# Patient Record
Sex: Male | Born: 1985 | Race: Black or African American | Hispanic: No | Marital: Single | Smoking: Never smoker
Health system: Southern US, Community
[De-identification: ages and names within clinical notes are randomized; demographics above are authoritative.]

## PROBLEM LIST (undated history)

## (undated) DIAGNOSIS — A4902 Methicillin resistant Staphylococcus aureus infection, unspecified site: Secondary | ICD-10-CM

## (undated) DIAGNOSIS — F319 Bipolar disorder, unspecified: Secondary | ICD-10-CM

## (undated) DIAGNOSIS — F1910 Other psychoactive substance abuse, uncomplicated: Secondary | ICD-10-CM

---

## 2020-11-13 ENCOUNTER — Emergency Department (HOSPITAL_COMMUNITY): Payer: Self-pay

## 2020-11-13 ENCOUNTER — Other Ambulatory Visit: Payer: Self-pay

## 2020-11-13 ENCOUNTER — Emergency Department (HOSPITAL_COMMUNITY)
Admission: EM | Admit: 2020-11-13 | Discharge: 2020-11-14 | Disposition: A | Discharge status: Home / Self Care | Payer: Self-pay | Attending: Emergency Medicine | Admitting: Emergency Medicine

## 2020-11-13 DIAGNOSIS — W19XXXA Unspecified fall, initial encounter: Secondary | ICD-10-CM

## 2020-11-13 DIAGNOSIS — M25561 Pain in right knee: Secondary | ICD-10-CM | POA: Insufficient documentation

## 2020-11-13 DIAGNOSIS — Z5321 Procedure and treatment not carried out due to patient leaving prior to being seen by health care provider: Secondary | ICD-10-CM | POA: Insufficient documentation

## 2020-11-13 LAB — CBC
HCT: 40.4 % (ref 39.0–52.0)
Hemoglobin: 13.5 g/dL (ref 13.0–17.0)
MCH: 32.6 pg (ref 26.0–34.0)
MCHC: 33.4 g/dL (ref 30.0–36.0)
MCV: 97.6 fL (ref 80.0–100.0)
Platelets: 281 10*3/uL (ref 150–400)
RBC: 4.14 MIL/uL — ABNORMAL LOW (ref 4.22–5.81)
RDW: 12.3 % (ref 11.5–15.5)
WBC: 7.2 10*3/uL (ref 4.0–10.5)
nRBC: 0 % (ref 0.0–0.2)

## 2020-11-13 LAB — BASIC METABOLIC PANEL
Anion gap: 11 (ref 5–15)
BUN: 13 mg/dL (ref 6–20)
CO2: 23 mmol/L (ref 22–32)
Calcium: 8.9 mg/dL (ref 8.9–10.3)
Chloride: 105 mmol/L (ref 98–111)
Creatinine, Ser: 0.72 mg/dL (ref 0.61–1.24)
GFR, Estimated: >60 mL/min (ref >60)
Glucose, Bld: 84 mg/dL (ref 70–99)
Potassium: 4.3 mmol/L (ref 3.5–5.1)
Sodium: 139 mmol/L (ref 135–145)

## 2020-11-13 MED ORDER — OXYCODONE-ACETAMINOPHEN 5-325 MG PO TABS
1.0000 | ORAL_TABLET | ORAL | Status: DC | PRN
Start: 2020-11-13 — End: 2020-11-14
  Administered 2020-11-13: 22:00:00 1 via ORAL
  Filled 2020-11-13: qty 1

## 2020-11-13 NOTE — ED Triage Notes (Signed)
Pt presents to ED POV. Pt c/o R knee pain. Pt states that he fell and his knee opened a few days ago. Pain has continued to worsen and now puss is coming out. Pt asked RN not to assess knee in triage d/t pain. Knee felt warm to touch.

## 2020-11-14 NOTE — ED Notes (Signed)
Pt called 2x for vitals w/o response at this time.

## 2020-11-14 NOTE — ED Notes (Signed)
Patient stated that he was leaving, and would be back, and to not take him out. I informed him, if we called him, to move him to a room, we would discharge him out. He stated not to put anything in chart.

## 2020-11-14 NOTE — ED Provider Notes (Signed)
Not seen by me. LBE from waiting room.   Mancel Bale, MD 11/14/20 [phone]

## 2020-11-14 NOTE — ED Notes (Signed)
This RN called pt name 3x. No answer. Pt moved OTF.

## 2021-03-21 ENCOUNTER — Encounter (HOSPITAL_COMMUNITY): Payer: Self-pay | Admitting: Emergency Medicine

## 2021-03-21 ENCOUNTER — Other Ambulatory Visit: Payer: Self-pay

## 2021-03-21 ENCOUNTER — Ambulatory Visit (HOSPITAL_COMMUNITY)
Admission: EM | Admit: 2021-03-21 | Discharge: 2021-03-21 | Disposition: A | Discharge status: Home / Self Care | Payer: Self-pay

## 2021-03-21 DIAGNOSIS — Z973 Presence of spectacles and contact lenses: Secondary | ICD-10-CM

## 2021-03-21 HISTORY — DX: Bipolar disorder, unspecified: F31.9

## 2021-03-21 NOTE — ED Provider Notes (Signed)
[facility]    CSN: [id number] Arrival date & time: 03/21/21  1544      History   Chief Complaint No chief complaint on file.   HPI Kenna Kirn is a 35 y.o. male presenting for possible contact in left eye.  States he went to bed with this in his eye 1 day ago, he woke up and could not find it.  States that he could not find the contact and it kind of feel like it still in the eye.  Denies absolutely any other symptoms.  Forgot his glasses today.  Denies photophobia, eye redness, eye crusting in the morning, eye pain, eye pain with movement, injury to eye, vision changes, double vision, excessive tearing, burning eyes, flashes of light in field of vision.  HPI  Past Medical History:  Diagnosis Date   Bipolar 1 disorder (HCC)     There are no problems to display for this patient.   History reviewed. No pertinent surgical history.     Home Medications    Prior to Admission medications   Not on File    Family History Family History  Problem Relation Age of Onset   Hypertension Mother    Diabetes Father     Social History Social History   Tobacco Use   Smoking status: Never   Smokeless tobacco: Never  Substance Use Topics   Alcohol use: Not Currently   Drug use: Never     Allergies   Patient has no known allergies.   Review of Systems Review of Systems  Eyes:  Negative for photophobia, pain, discharge, redness, itching and visual disturbance.  All other systems reviewed and are negative.   Physical Exam Triage Vital Signs ED Triage Vitals [03/21/21 1704]  Enc Vitals Group     BP 113/71     Pulse Rate 78     Resp 18     Temp 98 F (36.7 C)     Temp Source Oral     SpO2 100 %     Weight      Height      Head Circumference      Peak Flow      Pain Score 0     Pain Loc      Pain Edu?      Excl. in GC?    No data found.  Updated Vital Signs BP 113/71 (BP Location: Right Arm)   Pulse 78   Temp 98 F (36.7 C) (Oral)    Resp 18   SpO2 100%   Visual Acuity Right Eye Distance: 20/15 Left Eye Distance: 0 Bilateral Distance: 20/20  Right Eye Near:   Left Eye Near:    Bilateral Near:     Physical Exam Vitals reviewed.  Constitutional:      Appearance: Normal appearance.  HENT:     Head: Normocephalic and atraumatic.     Right Ear: Tympanic membrane, ear canal and external ear normal. There is no impacted cerumen.     Left Ear: Tympanic membrane, ear canal and external ear normal. There is no impacted cerumen.     Nose: Nose normal. No congestion.     Mouth/Throat:     Pharynx: Oropharynx is clear. No posterior oropharyngeal erythema.  Eyes:     General: Lids are normal. Lids are everted, no foreign bodies appreciated. Vision grossly intact. Gaze aligned appropriately. No visual field deficit.       Right eye: No foreign body, discharge or hordeolum.  Left eye: No foreign body, discharge or hordeolum.     Extraocular Movements: Extraocular movements intact.     Right eye: Normal extraocular motion and no nystagmus.     Left eye: Normal extraocular motion and no nystagmus.     Conjunctiva/sclera: Conjunctivae normal.     Right eye: Right conjunctiva is not injected. No chemosis, exudate or hemorrhage.    Left eye: Left conjunctiva is not injected. No chemosis, exudate or hemorrhage.    Pupils: Pupils are equal, round, and reactive to light.     Visual Fields: Right eye visual fields normal and left eye visual fields normal.     Comments: PERRLA, EOMI without pain. No conjunctival injection. No orbital tenderness. Visual acuity grossly intact. No foreign body present.   Cardiovascular:     Rate and Rhythm: Normal rate and regular rhythm.     Heart sounds: Normal heart sounds.  Pulmonary:     Effort: Pulmonary effort is normal.     Breath sounds: Normal breath sounds.  Neurological:     General: No focal deficit present.     Mental Status: He is alert.  Psychiatric:        Mood and  Affect: Mood normal.        Behavior: Behavior normal.        Thought Content: Thought content normal.        Judgment: Judgment normal.     UC Treatments / Results  Labs (all labs ordered are listed, but only abnormal results are displayed) Labs Reviewed - No data to display  EKG   Radiology No results found.  Procedures Procedures (including critical care time)  Medications Ordered in UC Medications - No data to display  Initial Impression / Assessment and Plan / UC Course  I have reviewed the triage vital signs and the nursing notes.  Pertinent labs & imaging results that were available during my care of the patient were reviewed by me and considered in my medical decision making (see chart for details).     This patient is a 35 year old male presenting for concern of a contact stuck in left eye.  Visual acuity is grossly intact, but he forgot his contacts and so cannot perform visual acuity screening.  Completely benign exam, no foreign body present.  Reassurance provided, follow-up with ophthalmology if symptoms worsen or persist.  Final Clinical Impressions(s) / UC Diagnoses   Final diagnoses:  Wears contact lenses     Discharge Instructions      -Follow-up with ophthalmology if symptoms worsen or persist-new redness, discharge, swelling, pain with eye movement, vision changes, flashes of light in your field of vision, etc.  Information below.     ED Prescriptions   None    PDMP not reviewed this encounter.   Rhys Martini, PA-C 03/21/21 1751

## 2021-03-21 NOTE — Discharge Instructions (Addendum)
-  Follow-up with ophthalmology if symptoms worsen or persist-new redness, discharge, swelling, pain with eye movement, vision changes, flashes of light in your field of vision, etc.  Information below.

## 2021-03-21 NOTE — ED Triage Notes (Signed)
Patient presents to [facility] for evaluation of misplacing his left contact.  States he went to bed with it in his eye, and believes it has rolled towards the side where he can't reach it to pull it out.  Denies eye pain.  States he has his normal poor vision without the contact in that eye.

## 2022-01-24 IMAGING — CR DG KNEE COMPLETE 4+V*R*
4 series · 4 of 4 positions shown · non-contrast
Comparison: None.

CLINICAL DATA: Right knee pain, fall

EXAM:
RIGHT KNEE - COMPLETE 4+ VIEW

[knee ap]
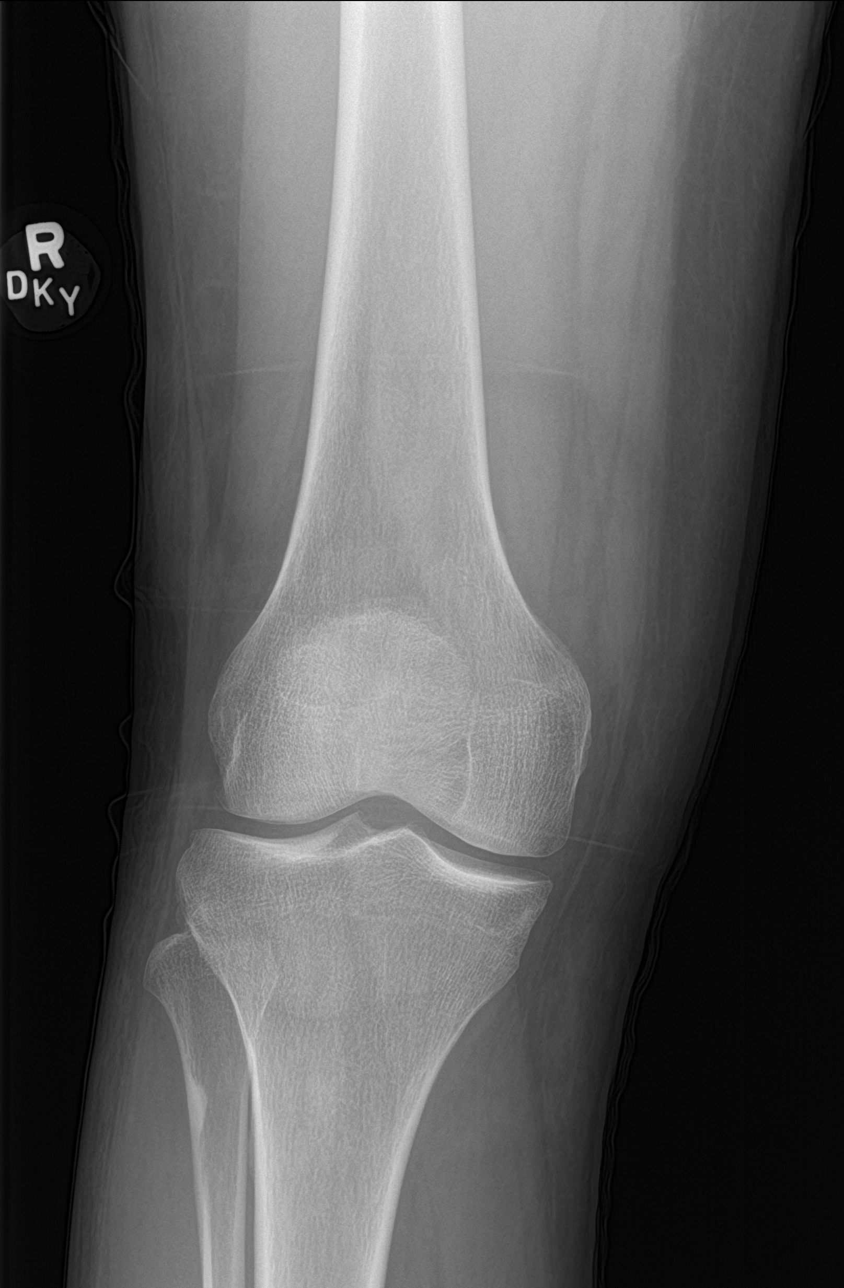

[knee lat]
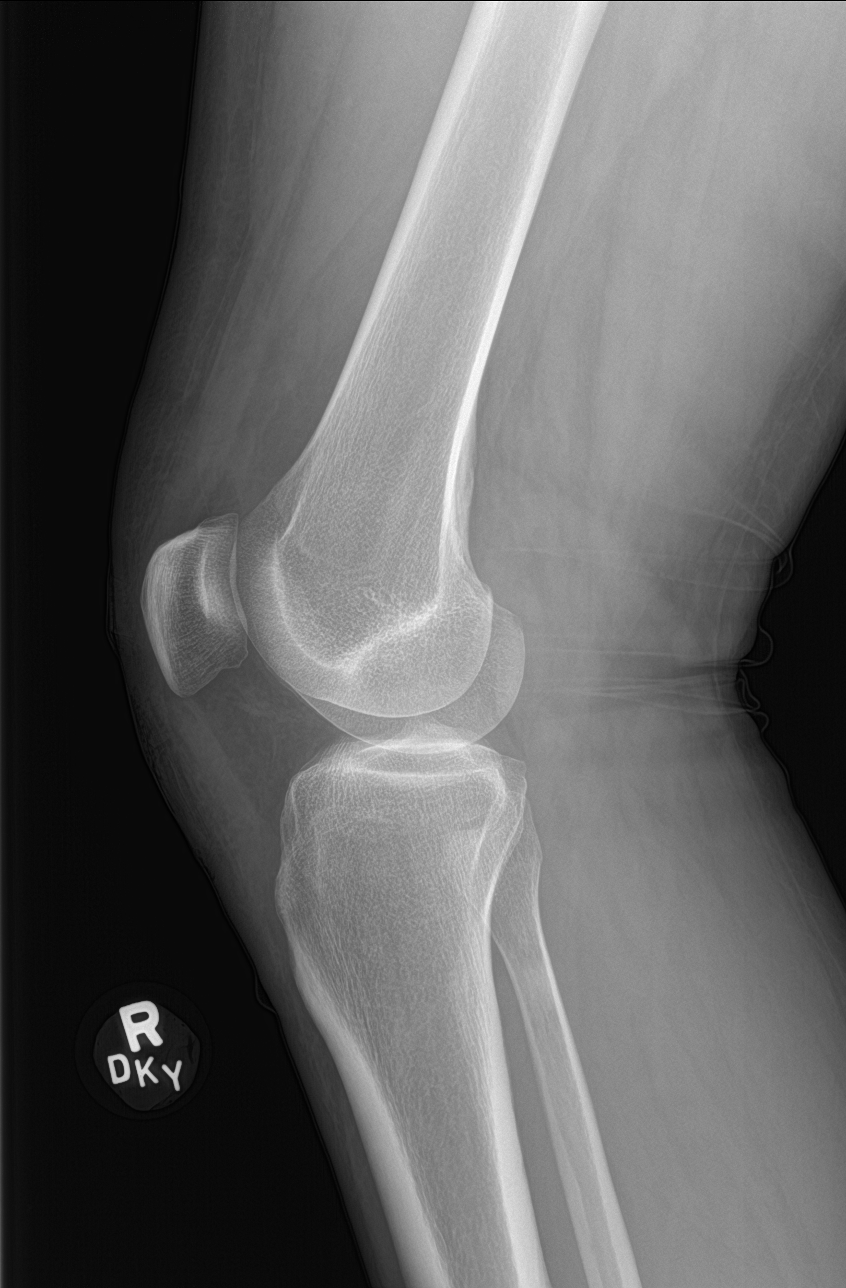

[knee obl (1 of 2)]
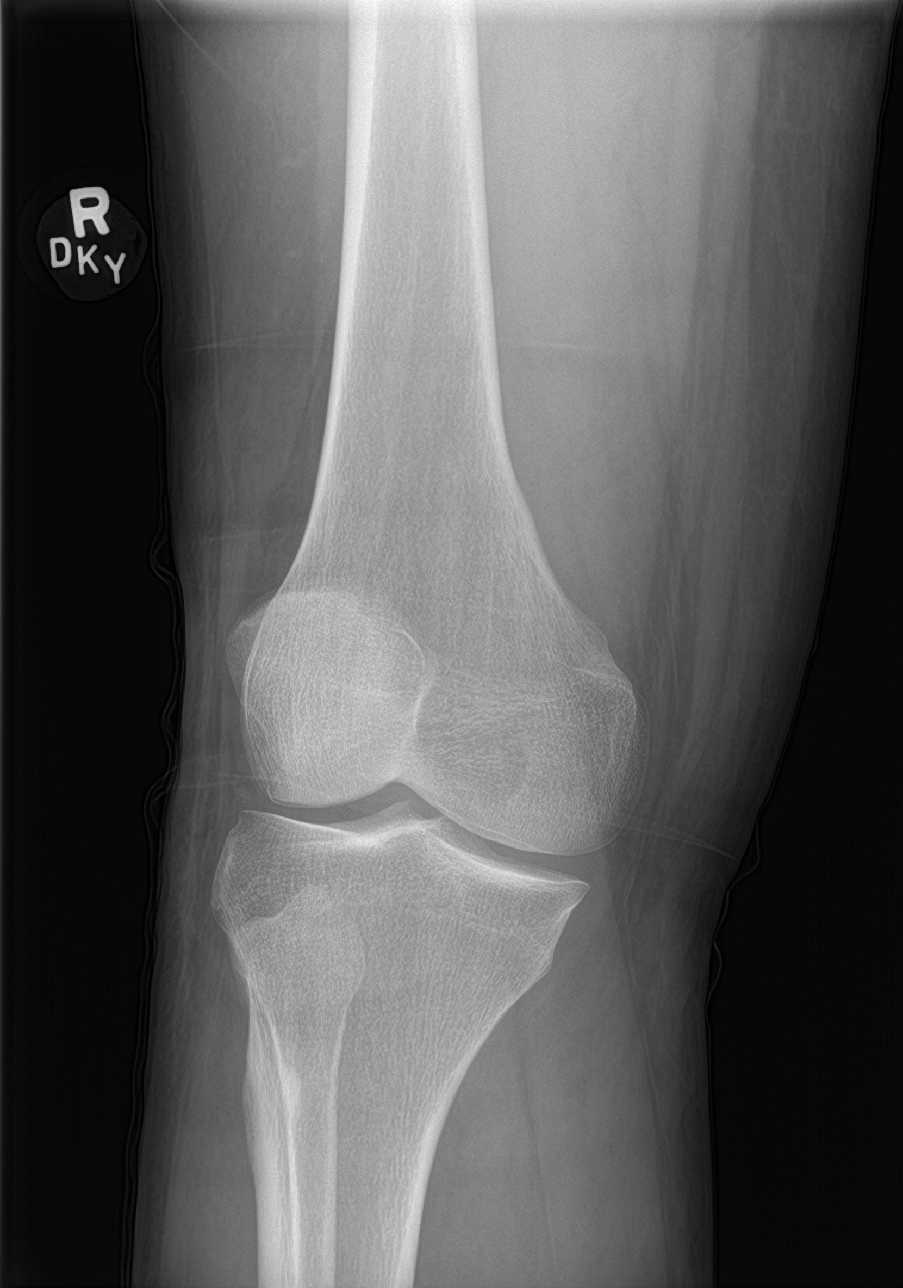

[knee obl (2 of 2)]
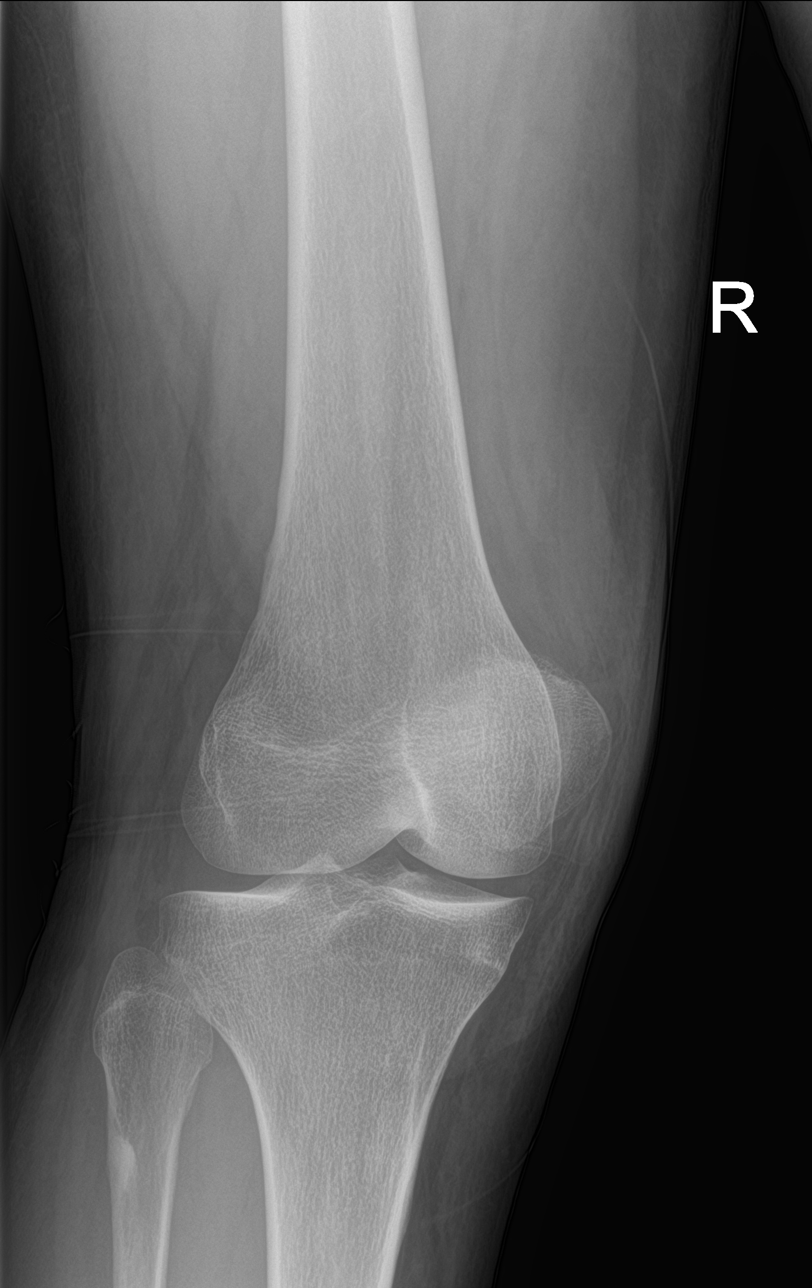

[4 of 4 positions shown; findings below may reference images not displayed]

FINDINGS: No evidence of fracture, dislocation, or joint effusion. No evidence
of arthropathy or other focal bone abnormality. Soft tissues are
unremarkable.
IMPRESSION: Negative.

## 2022-10-13 ENCOUNTER — Encounter (HOSPITAL_COMMUNITY): Payer: Self-pay

## 2022-10-13 ENCOUNTER — Other Ambulatory Visit: Payer: Self-pay

## 2022-10-13 ENCOUNTER — Emergency Department (HOSPITAL_COMMUNITY)
Admission: EM | Admit: 2022-10-13 | Discharge: 2022-10-13 | Disposition: A | Discharge status: Home / Self Care | Payer: Self-pay | Attending: Emergency Medicine | Admitting: Emergency Medicine

## 2022-10-13 DIAGNOSIS — R142 Eructation: Secondary | ICD-10-CM | POA: Insufficient documentation

## 2022-10-13 DIAGNOSIS — Z7689 Persons encountering health services in other specified circumstances: Secondary | ICD-10-CM

## 2022-10-13 NOTE — ED Triage Notes (Signed)
Pt arrived via POV, states he recently had a "stomach flu", sx have resolved, but requesting note to return to work. Pt also states while he is here he is requesting evaluation for excessive belching episodes that are becoming longer that have been going on since 8th grade.

## 2022-10-13 NOTE — ED Provider Notes (Signed)
  [place] DEPT Provider Note   CSN: [id number] Arrival date & time: 10/13/22  1014     History  Chief Complaint  Patient presents with   belching    Ronnie Herman is a 37 y.o. male with history of bipolar disorder who presents to the emergency department requesting note to return to work. Patient has a "stomach flu" last week. Symptoms have resolved at this time, but he is needing a work note. He also is complaining of excessive belching, which has been going on for 25+ years. States he has never brought it up with a medical provider before. He has tried Nurse, adult without relief. He thinks it could be behavioral as he doesn't notice it as much when he is not smoking weed or using drugs.   HPI     Home Medications Prior to Admission medications   Not on File      Allergies    Patient has no known allergies.    Review of Systems   Review of Systems  Gastrointestinal:        Belching  All other systems reviewed and are negative.   Physical Exam Updated Vital Signs BP 131/81 (BP Location: Left Arm)   Pulse 78   Temp 98.6 F (37 C) (Oral)   Resp 16   SpO2 99%  Physical Exam Vitals and nursing note reviewed.  Constitutional:      Appearance: Normal appearance.  HENT:     Head: Normocephalic and atraumatic.  Eyes:     Conjunctiva/sclera: Conjunctivae normal.  Pulmonary:     Effort: Pulmonary effort is normal. No respiratory distress.  Skin:    General: Skin is warm and dry.  Neurological:     Mental Status: He is alert.  Psychiatric:        Mood and Affect: Mood normal.        Behavior: Behavior normal.    ED Results / Procedures / Treatments   Labs (all labs ordered are listed, but only abnormal results are displayed) Labs Reviewed - No data to display  EKG None  Radiology No results found.  Procedures Procedures    Medications Ordered in ED Medications - No data to display  ED Course/ Medical Decision Making/  A&P                           Medical Decision Making  Patient is a 37 year old male with history of bipolar disorder who presents to the emergency department for chronic belching and requesting work note after having the stomach flu last week. Symptoms have resolved.   On exam patient has normal vital signs, in no acute distress.  Recommended he try over the counter medications such as Gas-X, pepcid or prilosec to help with his chronic belching. Given follow up with [facility] to establish with PCP. Patient discharged in stable condition and all questions answered.   Final Clinical Impression(s) / ED Diagnoses Final diagnoses:  Belching  Return to work evaluation    Rx / DC Orders ED Discharge Orders     None      Portions of this report may have been transcribed using voice recognition software. Every effort was made to ensure accuracy; however, inadvertent computerized transcription errors may be present.    Estill Cotta 10/13/22 1138    Valarie Merino, MD 10/13/22 1356

## 2022-10-13 NOTE — Discharge Instructions (Signed)
You were seen in the emergency department for a work note and to discuss chronic belching.  I recommend you take over the counter medications like Gas-X and reflux medications such as Pepcid or Prilosec.  I've attached contact information for one of the clinics we work with where you can establish with a primary care provider to follow up.

## 2022-10-29 ENCOUNTER — Other Ambulatory Visit: Payer: Self-pay

## 2022-10-29 ENCOUNTER — Emergency Department (HOSPITAL_COMMUNITY): Payer: Self-pay

## 2022-10-29 ENCOUNTER — Emergency Department (HOSPITAL_COMMUNITY)
Admission: EM | Admit: 2022-10-29 | Discharge: 2022-10-30 | Disposition: A | Discharge status: Home / Self Care | Payer: Self-pay | Attending: Emergency Medicine | Admitting: Emergency Medicine

## 2022-10-29 DIAGNOSIS — J189 Pneumonia, unspecified organism: Secondary | ICD-10-CM | POA: Insufficient documentation

## 2022-10-29 DIAGNOSIS — Z5900 Homelessness unspecified: Secondary | ICD-10-CM | POA: Insufficient documentation

## 2022-10-29 DIAGNOSIS — Z1152 Encounter for screening for COVID-19: Secondary | ICD-10-CM | POA: Insufficient documentation

## 2022-10-29 LAB — RESP PANEL BY RT-PCR (RSV, FLU A&B, COVID)  RVPGX2
Influenza A by PCR: NEGATIVE
Influenza B by PCR: NEGATIVE
Resp Syncytial Virus by PCR: NEGATIVE
SARS Coronavirus 2 by RT PCR: NEGATIVE

## 2022-10-29 LAB — GROUP A STREP BY PCR: Group A Strep by PCR: NOT DETECTED
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
## 2022-10-30 MED ORDER — DOXYCYCLINE HYCLATE 100 MG PO TABS
100.0000 mg | ORAL_TABLET | Freq: Once | ORAL | Status: AC
Start: 2022-10-30 — End: 2022-10-30
  Administered 2022-10-30: 100 mg via ORAL
  Filled 2022-10-30: qty 1

## 2022-10-30 MED ORDER — BENZONATATE 100 MG PO CAPS
100.0000 mg | ORAL_CAPSULE | Freq: Once | ORAL | Status: AC
Start: 2022-10-30 — End: 2022-10-30
  Administered 2022-10-30: 100 mg via ORAL
  Filled 2022-10-30 (×2): qty 1

## 2022-10-30 MED ORDER — BENZONATATE 100 MG PO CAPS: 100.0000 mg | ORAL_CAPSULE | Freq: Three times a day (TID) | ORAL | 0 refills | Status: DC

## 2022-10-30 MED ORDER — DOXYCYCLINE HYCLATE 100 MG PO CAPS: 100.0000 mg | ORAL_CAPSULE | Freq: Two times a day (BID) | ORAL | 0 refills | Status: DC

## 2022-10-30 NOTE — Discharge Instructions (Signed)
Take the prescribed medication as directed. Follow-up with your primary care doctor-- will need repeat chest x-ray once you finish antibiotics to make sure infection has cleared. Return to the ED for new or worsening symptoms.
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
## 2022-11-04 ENCOUNTER — Emergency Department (HOSPITAL_COMMUNITY)
Admission: EM | Admit: 2022-11-04 | Discharge: 2022-11-04 | Disposition: A | Discharge status: Home / Self Care | Payer: Self-pay | Attending: Emergency Medicine | Admitting: Emergency Medicine

## 2022-11-04 ENCOUNTER — Other Ambulatory Visit: Payer: Self-pay

## 2022-11-04 ENCOUNTER — Emergency Department (HOSPITAL_COMMUNITY): Payer: Self-pay

## 2022-11-04 DIAGNOSIS — J189 Pneumonia, unspecified organism: Secondary | ICD-10-CM | POA: Insufficient documentation

## 2022-11-04 NOTE — ED Triage Notes (Signed)
Pt reports "I need work note to return to work" Pt seen for PNA on 10/29/22.  Pt denies taking abt due to no having insurance.  Denies fever, sob, body aches, chills.

## 2022-11-04 NOTE — Discharge Instructions (Addendum)
Please read and follow all provided instructions.  Your diagnoses today include:  1. Community acquired pneumonia of left lung, unspecified part of lung    Tests performed today include: Chest x-ray -- shows pneumonia Vital signs. See below for your results today.   Medications prescribed:  None  Take any prescribed medications only as directed.  Home care instructions:  Follow any educational materials contained in this packet.  Ideally fill and take the antibiotic prescription you were prescribed previously.  Follow-up instructions: You will need to follow-up with your doctor in 3 to 4 weeks for repeat chest x-ray to ensure pneumonia is resolving, if the chest x-ray is still abnormal you would need further evaluation for this.  Return instructions:  Please return to the Emergency Department if you experience worsening symptoms.  Return immediately with worsening breathing, worsening shortness of breath, or if you feel it is taking you more effort to breathe.  Please return if you have any other emergent concerns.  Additional Information:  Your vital signs today were: BP 120/87   Pulse 98   Temp 98 F (36.7 C)   Resp 20   SpO2 100%  If your blood pressure (BP) was elevated above 135/85 this visit, please have this repeated by your doctor within one month. --------------

## 2022-11-04 NOTE — ED Provider Triage Note (Signed)
Emergency Medicine Provider Triage Evaluation Note  Ronnie Herman , a 37 y.o. male  was evaluated in triage.  Pt complains of recent ammonia.  Patient reports that he needed to come to the emergency department for a work note to be able to return to work as he was previously diagnosed with pneumonia on 10/29/2022.  Patient reports he has not taken the antibiotic as prescribed as he cannot afford the medication.  Patient denies any shortness of breath, fevers.  Patient still having a slight somewhat productive cough.  Review of Systems  Positive: As above Negative: As above  Physical Exam  There were no vitals taken for this visit. Gen:   Awake, no distress   Resp:  Normal effort no wheezing or crackles MSK:   Moves extremities without difficulty  Other:    Medical Decision Making  Medically screening exam initiated at 12:56 PM.  Appropriate orders placed.  Tramar Brueckner was informed that the remainder of the evaluation will be completed by another provider, this initial triage assessment does not replace that evaluation, and the importance of remaining in the ED until their evaluation is complete.     Luvenia Heller, PA-C 11/04/22 1257

## 2022-11-04 NOTE — ED Provider Notes (Signed)
[place] EMERGENCY DEPARTMENT AT [facility] Provider Note   CSN: [id number] Arrival date & time: 11/04/22  1245     History  Chief Complaint  Patient presents with   work note    Ronnie Herman is a 37 y.o. male.  Patient presents to the emergency department today for medical clearance to return to work after being diagnosed with community-acquired pneumonia on 10/29/2022.  Patient states that they are feeling a lot better now.  They have some residual cough, but no fevers, shortness of breath.  Patient states that they are ready to go back to work.  Unfortunately, they were unable to fill the antibiotic prescribed at previous visit.  Also, was recently seen by [facility], but does not currently have a PCP.       Home Medications Prior to Admission medications   Medication Sig Start Date End Date Taking? Authorizing Provider  benzonatate (TESSALON) 100 MG capsule Take 1 capsule (100 mg total) by mouth every 8 (eight) hours. 10/30/22   Larene Pickett, PA-C  doxycycline (VIBRAMYCIN) 100 MG capsule Take 1 capsule (100 mg total) by mouth 2 (two) times daily. 10/30/22   Larene Pickett, PA-C      Allergies    Patient has no known allergies.    Review of Systems   Review of Systems  Physical Exam Updated Vital Signs BP 120/87   Pulse 98   Temp 98 F (36.7 C)   Resp 20   SpO2 100%  Physical Exam Vitals and nursing note reviewed.  Constitutional:      General: He is not in acute distress.    Appearance: He is well-developed.  HENT:     Head: Normocephalic and atraumatic.  Eyes:     General:        Right eye: No discharge.        Left eye: No discharge.     Conjunctiva/sclera: Conjunctivae normal.  Cardiovascular:     Rate and Rhythm: Normal rate and regular rhythm.     Heart sounds: Normal heart sounds.  Pulmonary:     Effort: Pulmonary effort is normal.     Breath sounds: Normal breath sounds.     Comments: Normal respiratory  effort, lungs clear to auscultation bilaterally. Abdominal:     Palpations: Abdomen is soft.     Tenderness: There is no abdominal tenderness.  Musculoskeletal:     Cervical back: Normal range of motion and neck supple.  Skin:    General: Skin is warm and dry.  Neurological:     Mental Status: He is alert.     ED Results / Procedures / Treatments   Labs (all labs ordered are listed, but only abnormal results are displayed) Labs Reviewed - No data to display  EKG None  Radiology DG Chest 2 View  Result Date: 11/04/2022 CLINICAL DATA:  Slightly productive cough for the past week, diagnosed pneumonia. EXAM: CHEST - 2 VIEW COMPARISON:  10/29/2022 FINDINGS: Normal sized heart. No significant change in the previously demonstrated airspace opacity in the lingula. The remainder of the lungs are clear. Mild scoliosis. IMPRESSION: Stable probable lingular pneumonia. Followup PA and lateral chest X-ray is recommended in 3-4 weeks following trial of antibiotic therapy to ensure resolution and exclude underlying malignancy. Electronically Signed   By: Claudie Revering M.D.   On: 11/04/2022 13:11    Procedures Procedures    Medications Ordered in ED Medications - No data to display  ED Course/ Medical  Decision Making/ A&P    Patient seen and examined. History obtained directly from patient. Work-up including labs, imaging, EKG ordered in triage, if performed, were reviewed.    Labs/EKG: Ordered  Imaging: Chest x-ray, agree stable from previous  Medications/Fluids: None ordered  Most recent vital signs reviewed and are as follows: BP 120/87   Pulse 98   Temp 98 F (36.7 C)   Resp 20   SpO2 100%   Initial impression: Community acquired pneumonia, clinically improved.  Vital signs reassuring.  Home treatment plan: Continued monitoring of symptoms  Return instructions discussed with patient: Return with worsening shortness of breath, fever, worsening cough, new symptoms or other  concerns.  Follow-up instructions discussed with patient: Discussed recommendations for follow-up chest x-ray in 3 to 4 weeks to ensure resolution of pneumonia and to ensure no need for further evaluation.     Click here for ABCD2, HEART and other calculatorsREFRESH Note before signing :1}                          Medical Decision Making  Patient with clinically improved community-acquired pneumonia.  Low concern for pneumothorax, PE.  Low concern for complicated pneumonia such as empyema.  Recommendations discussed with patient as above.  They will be given note to return to work.  The patient's vital signs, pertinent lab work and imaging were reviewed and interpreted as discussed in the ED course. Hospitalization was considered for further testing, treatments, or serial exams/observation. However as patient is well-appearing, has a stable exam, and reassuring studies today, I do not feel that they warrant admission at this time. This plan was discussed with the patient who verbalizes agreement and comfort with this plan and seems reliable and able to return to the Emergency Department with worsening or changing symptoms.          Final Clinical Impression(s) / ED Diagnoses Final diagnoses:  Community acquired pneumonia of left lung, unspecified part of lung    Rx / DC Orders ED Discharge Orders     None         Carlisle Cater, PA-C 11/04/22 [place], [place], DO 11/04/22 1943

## 2022-11-14 ENCOUNTER — Emergency Department (HOSPITAL_COMMUNITY): Payer: Medicaid Other

## 2022-11-14 ENCOUNTER — Encounter (HOSPITAL_COMMUNITY): Payer: Self-pay | Admitting: Emergency Medicine

## 2022-11-14 ENCOUNTER — Other Ambulatory Visit: Payer: Self-pay

## 2022-11-14 ENCOUNTER — Inpatient Hospital Stay (HOSPITAL_COMMUNITY)
Admission: EM | Admit: 2022-11-14 | Discharge: 2022-11-16 | DRG: 871 | Disposition: A | Discharge status: Home / Self Care | Payer: Medicaid Other | Attending: Internal Medicine | Admitting: Internal Medicine

## 2022-11-14 DIAGNOSIS — Z8249 Family history of ischemic heart disease and other diseases of the circulatory system: Secondary | ICD-10-CM

## 2022-11-14 DIAGNOSIS — Z1152 Encounter for screening for COVID-19: Secondary | ICD-10-CM

## 2022-11-14 DIAGNOSIS — F1210 Cannabis abuse, uncomplicated: Secondary | ICD-10-CM | POA: Diagnosis present

## 2022-11-14 DIAGNOSIS — J189 Pneumonia, unspecified organism: Secondary | ICD-10-CM | POA: Diagnosis not present

## 2022-11-14 DIAGNOSIS — N50819 Testicular pain, unspecified: Secondary | ICD-10-CM | POA: Diagnosis present

## 2022-11-14 DIAGNOSIS — R0902 Hypoxemia: Secondary | ICD-10-CM | POA: Diagnosis present

## 2022-11-14 DIAGNOSIS — K047 Periapical abscess without sinus: Secondary | ICD-10-CM | POA: Diagnosis present

## 2022-11-14 DIAGNOSIS — Z5901 Sheltered homelessness: Secondary | ICD-10-CM

## 2022-11-14 DIAGNOSIS — F1510 Other stimulant abuse, uncomplicated: Secondary | ICD-10-CM | POA: Diagnosis present

## 2022-11-14 DIAGNOSIS — F319 Bipolar disorder, unspecified: Secondary | ICD-10-CM | POA: Diagnosis present

## 2022-11-14 DIAGNOSIS — F1990 Other psychoactive substance use, unspecified, uncomplicated: Secondary | ICD-10-CM | POA: Diagnosis present

## 2022-11-14 DIAGNOSIS — F419 Anxiety disorder, unspecified: Secondary | ICD-10-CM | POA: Diagnosis present

## 2022-11-14 DIAGNOSIS — J159 Unspecified bacterial pneumonia: Secondary | ICD-10-CM | POA: Diagnosis present

## 2022-11-14 DIAGNOSIS — A419 Sepsis, unspecified organism: Principal | ICD-10-CM | POA: Diagnosis present

## 2022-11-14 DIAGNOSIS — Z833 Family history of diabetes mellitus: Secondary | ICD-10-CM

## 2022-11-14 DIAGNOSIS — E872 Acidosis, unspecified: Secondary | ICD-10-CM | POA: Diagnosis present

## 2022-11-14 DIAGNOSIS — E869 Volume depletion, unspecified: Secondary | ICD-10-CM | POA: Diagnosis present

## 2022-11-14 DIAGNOSIS — F1910 Other psychoactive substance abuse, uncomplicated: Secondary | ICD-10-CM | POA: Diagnosis present

## 2022-11-14 DIAGNOSIS — E876 Hypokalemia: Secondary | ICD-10-CM | POA: Diagnosis present

## 2022-11-14 HISTORY — DX: Other psychoactive substance abuse, uncomplicated: F19.10

## 2022-11-14 LAB — CBC
HCT: 41.8 % (ref 39.0–52.0)
Hemoglobin: 13.4 g/dL (ref 13.0–17.0)
MCH: 31.2 pg (ref 26.0–34.0)
MCHC: 32.1 g/dL (ref 30.0–36.0)
MCV: 97.2 fL (ref 80.0–100.0)
Platelets: 325 10*3/uL (ref 150–400)
RBC: 4.3 MIL/uL (ref 4.22–5.81)
RDW: 13.3 % (ref 11.5–15.5)
WBC: 25.4 10*3/uL — ABNORMAL HIGH (ref 4.0–10.5)
nRBC: 0 % (ref 0.0–0.2)

## 2022-11-14 LAB — RESP PANEL BY RT-PCR (RSV, FLU A&B, COVID)  RVPGX2
Influenza A by PCR: NEGATIVE
Influenza B by PCR: NEGATIVE
Resp Syncytial Virus by PCR: NEGATIVE
SARS Coronavirus 2 by RT PCR: NEGATIVE

## 2022-11-14 LAB — RAPID URINE DRUG SCREEN, HOSP PERFORMED
Amphetamines: POSITIVE — AB
Barbiturates: NOT DETECTED
Benzodiazepines: NOT DETECTED
Cocaine: NOT DETECTED
Opiates: NOT DETECTED
Tetrahydrocannabinol: POSITIVE — AB

## 2022-11-14 LAB — BASIC METABOLIC PANEL
Anion gap: 12 (ref 5–15)
BUN: 15 mg/dL (ref 6–20)
CO2: 27 mmol/L (ref 22–32)
Calcium: 9 mg/dL (ref 8.9–10.3)
Chloride: 95 mmol/L — ABNORMAL LOW (ref 98–111)
Creatinine, Ser: 1.19 mg/dL (ref 0.61–1.24)
GFR, Estimated: >60 mL/min (ref >60)
Glucose, Bld: 104 mg/dL — ABNORMAL HIGH (ref 70–99)
Potassium: 3.5 mmol/L (ref 3.5–5.1)
Sodium: 134 mmol/L — ABNORMAL LOW (ref 135–145)

## 2022-11-14 LAB — D-DIMER, QUANTITATIVE: D-Dimer, Quant: 1.78 ug/mL-FEU — ABNORMAL HIGH (ref 0.00–0.50)

## 2022-11-14 LAB — TROPONIN I (HIGH SENSITIVITY)
Troponin I (High Sensitivity): 2 ng/L (ref <18)
Troponin I (High Sensitivity): 3 ng/L (ref <18)

## 2022-11-14 LAB — STREP PNEUMONIAE URINARY ANTIGEN: Strep Pneumo Urinary Antigen: NEGATIVE

## 2022-11-14 MED ORDER — SODIUM CHLORIDE 0.9 % IV BOLUS
1000.0000 mL | Freq: Once | INTRAVENOUS | Status: AC
  Administered 2022-11-14: 1000 mL via INTRAVENOUS

## 2022-11-14 MED ORDER — TRAZODONE HCL 50 MG PO TABS
50.0000 mg | ORAL_TABLET | Freq: Every evening | ORAL | Status: DC | PRN
  Administered 2022-11-14 – 2022-11-15 (×2): 50 mg via ORAL
  Filled 2022-11-14 (×2): qty 1

## 2022-11-14 MED ORDER — TRAMADOL HCL 50 MG PO TABS
50.0000 mg | ORAL_TABLET | Freq: Three times a day (TID) | ORAL | Status: DC | PRN
  Administered 2022-11-14 – 2022-11-16 (×4): 50 mg via ORAL
  Filled 2022-11-14 (×4): qty 1

## 2022-11-14 MED ORDER — ENOXAPARIN SODIUM 40 MG/0.4ML IJ SOSY
40.0000 mg | PREFILLED_SYRINGE | INTRAMUSCULAR | Status: DC
  Administered 2022-11-14: 40 mg via SUBCUTANEOUS
  Filled 2022-11-14 (×2): qty 0.4

## 2022-11-14 MED ORDER — SODIUM CHLORIDE 0.9 % IV SOLN: INTRAVENOUS | Status: AC

## 2022-11-14 MED ORDER — GUAIFENESIN ER 600 MG PO TB12
600.0000 mg | ORAL_TABLET | Freq: Two times a day (BID) | ORAL | Status: DC
  Administered 2022-11-14 – 2022-11-16 (×4): 600 mg via ORAL
  Filled 2022-11-14 (×4): qty 1

## 2022-11-14 MED ORDER — SODIUM CHLORIDE 0.9 % IV SOLN
2.0000 g | INTRAVENOUS | Status: DC
  Administered 2022-11-14 – 2022-11-15 (×2): 2 g via INTRAVENOUS
  Filled 2022-11-14 (×2): qty 20

## 2022-11-14 MED ORDER — SODIUM CHLORIDE 0.9 % IV SOLN
500.0000 mg | INTRAVENOUS | Status: DC
  Administered 2022-11-14 – 2022-11-15 (×2): 500 mg via INTRAVENOUS
  Filled 2022-11-14 (×3): qty 5

## 2022-11-14 MED ORDER — ONDANSETRON HCL 4 MG PO TABS: 4.0000 mg | ORAL_TABLET | Freq: Four times a day (QID) | ORAL | Status: DC | PRN

## 2022-11-14 MED ORDER — IOHEXOL 350 MG/ML SOLN
100.0000 mL | Freq: Once | INTRAVENOUS | Status: AC | PRN
  Administered 2022-11-14: 100 mL via INTRAVENOUS

## 2022-11-14 MED ORDER — ACETAMINOPHEN 325 MG PO TABS
650.0000 mg | ORAL_TABLET | Freq: Four times a day (QID) | ORAL | Status: DC | PRN
  Administered 2022-11-14: 650 mg via ORAL
  Filled 2022-11-14: qty 2

## 2022-11-14 MED ORDER — LORAZEPAM 0.5 MG PO TABS: 0.5000 mg | ORAL_TABLET | Freq: Four times a day (QID) | ORAL | Status: DC | PRN

## 2022-11-14 MED ORDER — ALBUTEROL SULFATE (2.5 MG/3ML) 0.083% IN NEBU: 2.5000 mg | INHALATION_SOLUTION | RESPIRATORY_TRACT | Status: DC | PRN

## 2022-11-14 MED ORDER — ONDANSETRON HCL 4 MG/2ML IJ SOLN: 4.0000 mg | Freq: Four times a day (QID) | INTRAMUSCULAR | Status: DC | PRN

## 2022-11-14 NOTE — ED Provider Notes (Signed)
[place] Provider Note   CSN: [id number] Arrival date & time: 11/14/22  1344     History  Chief Complaint  Patient presents with   Fever   Chest Pain   Testicle Pain    Ronnie Herman is a 37 y.o. male with a PMH of bipolar disorder and drug abuse presenting today for evaluation of shortness of breath and chest pain. Patient reports he started to have shortness of breath about 2 weeks ago. Symptoms improved for about a week then he started to have shortness of breath, cough, chest pain, sweating that got worse yesterday.  Pain is located on the R side of his chest, constant, tightness, worsened with cough and deep breathing. He was diagnosed with pneumonia 2 weeks ago and has finished his antibiotics. Patient admits to marijuana use, last used yesterday.   Fever Associated symptoms: chest pain   Chest Pain Associated symptoms: fever   Testicle Pain Associated symptoms include chest pain.    Past Medical History:  Diagnosis Date   Bipolar 1 disorder ([place])    Drug abuse ([place])    History reviewed. No pertinent surgical history.   Home Medications Prior to Admission medications   Medication Sig Start Date End Date Taking? Authorizing Provider  benzonatate (TESSALON) 100 MG capsule Take 1 capsule (100 mg total) by mouth every 8 (eight) hours. Patient not taking: Reported on 11/15/2022 10/30/22   Larene Pickett, PA-C  doxycycline (VIBRAMYCIN) 100 MG capsule Take 1 capsule (100 mg total) by mouth 2 (two) times daily. Patient not taking: Reported on 11/15/2022 10/30/22   Larene Pickett, PA-C      Allergies    Patient has no known allergies.    Review of Systems   Review of Systems  Constitutional:  Positive for fever.  Cardiovascular:  Positive for chest pain.  Genitourinary:  Positive for testicular pain.    Physical Exam Updated Vital Signs BP 123/70 (BP Location: Left Arm)   Pulse (!) 109   Temp 98.6 F (37 C)  (Oral)   Resp 20   Ht 6' (1.829 m)   Wt 80.5 kg   SpO2 100%   BMI 24.07 kg/m  Physical Exam Vitals and nursing note reviewed.  Constitutional:      Appearance: Normal appearance.  HENT:     Head: Normocephalic and atraumatic.     Mouth/Throat:     Mouth: Mucous membranes are moist.  Eyes:     General: No scleral icterus. Cardiovascular:     Rate and Rhythm: Regular rhythm. Tachycardia present.     Pulses: Normal pulses.     Heart sounds: Normal heart sounds.  Pulmonary:     Effort: Pulmonary effort is normal. Tachypnea present.     Breath sounds: Normal breath sounds.  Abdominal:     General: Abdomen is flat.     Palpations: Abdomen is soft.     Tenderness: There is no abdominal tenderness.  Musculoskeletal:        General: No deformity.  Skin:    General: Skin is warm.     Findings: No rash.  Neurological:     General: No focal deficit present.     Mental Status: He is alert.  Psychiatric:        Mood and Affect: Mood normal.     ED Results / Procedures / Treatments   Labs (all labs ordered are listed, but only abnormal results are displayed) Labs Reviewed  BASIC  METABOLIC PANEL - Abnormal; Notable for the following components:      Result Value   Sodium 134 (*)    Chloride 95 (*)    Glucose, Bld 104 (*)    All other components within normal limits  CBC - Abnormal; Notable for the following components:   WBC 25.4 (*)    All other components within normal limits  D-DIMER, QUANTITATIVE - Abnormal; Notable for the following components:   D-Dimer, Quant 1.78 (*)    All other components within normal limits  RAPID URINE DRUG SCREEN, HOSP PERFORMED - Abnormal; Notable for the following components:   Amphetamines POSITIVE (*)    Tetrahydrocannabinol POSITIVE (*)    All other components within normal limits  COMPREHENSIVE METABOLIC PANEL - Abnormal; Notable for the following components:   Sodium 133 (*)    Potassium 3.3 (*)    Glucose, Bld 116 (*)    Calcium  7.9 (*)    Total Protein 5.9 (*)    Albumin 2.9 (*)    Total Bilirubin 1.7 (*)    All other components within normal limits  CBC WITH DIFFERENTIAL/PLATELET - Abnormal; Notable for the following components:   WBC 25.4 (*)    RBC 3.27 (*)    Hemoglobin 10.2 (*)    HCT 32.0 (*)    Neutro Abs 21.9 (*)    Monocytes Absolute 1.6 (*)    Abs Immature Granulocytes 0.75 (*)    All other components within normal limits  LACTIC ACID, PLASMA - Abnormal; Notable for the following components:   Lactic Acid, Venous 3.0 (*)    All other components within normal limits  RESP PANEL BY RT-PCR (RSV, FLU A&B, COVID)  RVPGX2  CULTURE, BLOOD (ROUTINE X 2)  MRSA NEXT GEN BY PCR, NASAL  EXPECTORATED SPUTUM ASSESSMENT W GRAM STAIN, RFLX TO RESP C  CULTURE, BLOOD (ROUTINE X 2)  HIV ANTIBODY (ROUTINE TESTING W REFLEX)  STREP PNEUMONIAE URINARY ANTIGEN  PHOSPHORUS  MAGNESIUM  LACTIC ACID, PLASMA  LEGIONELLA PNEUMOPHILA SEROGP 1 UR AG  TROPONIN I (HIGH SENSITIVITY)  TROPONIN I (HIGH SENSITIVITY)    EKG EKG Interpretation  Date/Time:  Wednesday November 14 2022 14:34:36 EST Ventricular Rate:  118 PR Interval:  137 QRS Duration: 79 QT Interval:  290 QTC Calculation: 407 R Axis:   75 Text Interpretation: Sinus tachycardia no prior ECG for comaprison. No STEMI Confirmed by Antony Blackbird [phone]) on 11/14/2022 3:41:28 PM  Radiology CT Angio Chest PE W and/or Wo Contrast  Result Date: 11/14/2022 CLINICAL DATA:  Fever and chills, pneumonia 2 weeks ago, rib pain with inspiration EXAM: CT ANGIOGRAPHY CHEST WITH CONTRAST TECHNIQUE: Multidetector CT imaging of the chest was performed using the standard protocol during bolus administration of intravenous contrast. Multiplanar CT image reconstructions and MIPs were obtained to evaluate the vascular anatomy. RADIATION DOSE REDUCTION: This exam was performed according to the departmental dose-optimization program which includes automated exposure control, adjustment  of the mA and/or kV according to patient size and/or use of iterative reconstruction technique. CONTRAST:  144m OMNIPAQUE IOHEXOL 350 MG/ML SOLN COMPARISON:  11/14/2022 FINDINGS: Cardiovascular: This is a technically adequate evaluation of the pulmonary vasculature. No filling defects or pulmonary emboli. The heart is unremarkable without pericardial effusion. No thoracic aortic aneurysm or dissection. Mediastinum/Nodes: No enlarged mediastinal, hilar, or axillary lymph nodes. Thyroid gland, trachea, and esophagus demonstrate no significant findings. Lungs/Pleura: There is multifocal bibasilar airspace disease, most pronounced within the right lower lobe. No evidence of cavitation. No effusion or  pneumothorax. Central airways are patent. Upper Abdomen: No acute abnormality. Musculoskeletal: No chest wall abnormality. No acute or significant osseous findings. Review of the MIP images confirms the above findings. IMPRESSION: 1. No evidence of pulmonary embolus. 2. Multifocal bibasilar pneumonia, most pronounced within the right lower lobe. This has progressed since the 11/04/2022 exam. Electronically Signed   By: Randa Ngo M.D.   On: 11/14/2022 18:34   DG Chest 2 View  Result Date: 11/14/2022 CLINICAL DATA:  Chest pain, recent pneumonia.  Fever and chills. EXAM: CHEST - 2 VIEW COMPARISON:  11/04/2022. FINDINGS: Trachea is midline. Heart size stable. Bibasilar airspace opacities, increased on the right. No definite pleural fluid. IMPRESSION: Bibasilar airspace opacification, slightly increased on the right, indicative of pneumonia. Electronically Signed   By: Lorin Picket M.D.   On: 11/14/2022 14:33    Procedures Procedures    Medications Ordered in ED Medications  cefTRIAXone (ROCEPHIN) 2 g in sodium chloride 0.9 % 100 mL IVPB (0 g Intravenous Stopped 11/14/22 2147)  azithromycin (ZITHROMAX) 500 mg in sodium chloride 0.9 % 250 mL IVPB ( Intravenous Restarted 11/14/22 2200)  enoxaparin (LOVENOX)  injection 40 mg (40 mg Subcutaneous Given 11/14/22 2212)  0.9 %  sodium chloride infusion ( Intravenous New Bag/Given 11/15/22 1110)  traMADol (ULTRAM) tablet 50 mg (50 mg Oral Given 11/15/22 1127)  ondansetron (ZOFRAN) tablet 4 mg (has no administration in time range)    Or  ondansetron (ZOFRAN) injection 4 mg (has no administration in time range)  albuterol (PROVENTIL) (2.5 MG/3ML) 0.083% nebulizer solution 2.5 mg (has no administration in time range)  guaiFENesin (MUCINEX) 12 hr tablet 600 mg (600 mg Oral Given 11/15/22 0832)  LORazepam (ATIVAN) tablet 0.5 mg (has no administration in time range)  acetaminophen (TYLENOL) tablet 650 mg (650 mg Oral Given 11/14/22 2216)  traZODone (DESYREL) tablet 50 mg (50 mg Oral Given 11/14/22 2216)  sodium chloride 0.9 % bolus 1,000 mL (0 mLs Intravenous Stopped 11/14/22 2146)  iohexol (OMNIPAQUE) 350 MG/ML injection 100 mL (100 mLs Intravenous Contrast Given 11/14/22 1811)  sodium chloride 0.9 % bolus 1,000 mL (0 mLs Intravenous Stopped 11/14/22 2146)  potassium chloride SA (KLOR-CON M) CR tablet 40 mEq (40 mEq Oral Given 11/15/22 [id number])    ED Course/ Medical Decision Making/ A&P                             Medical Decision Making Amount and/or Complexity of Data Reviewed Labs: ordered. Radiology: ordered.  Risk Prescription drug management. Decision regarding hospitalization.   This patient presents to the ED for chest pain, shortness of breath, this involves an extensive number of treatment options, and is a complaint that carries with a high risk of complications and morbidity.  The differential diagnosis includes CHF/ACS, COPD asthma, pneumonia, anaphylaxis, PE, pneumothorax, anxiety, COVID-19.  This is not an exhaustive list.  Lab tests: I ordered and personally interpreted labs.  The pertinent results include: WBC 25.4. D-dimer 1.78. Hbg unremarkable. Platelets unremarkable. No electrolyte abnormalities noted. BUN, creatinine unremarkable.  Urine drug  screen positive for cocaine and tetrahydrocannabinol.  Imaging studies: I ordered imaging studies. I personally reviewed, interpreted imaging and agree with the radiologist's interpretations. The results include: Chest x-ray show pneumonia on the right lower lobe.  Problem list/ ED course/ Critical interventions/ Medical management: HPI: See above Vital signs within normal range and stable throughout visit. Laboratory/imaging studies significant for: See above. On physical examination, patient is afebrile  and appears in no acute distress. This patient presents with dyspnea, most likely secondary to pneumonia. Presentation not consistent with acute cardiac etiologies to include ACS (non ischemic ekg, unremarkable trop), CHF, pericardial effusion / tamponade . Presentation not consistent with acute respiratory etiologies to include acute PE ([facility] low risk, negative CT angio), pneumothorax , asthma, COPD exacerbation, allergic etiologies. However can not rule out sepsis as patient with leukocytosis 25.4, tachycardic and tachypneic. IV fluid and antibiotic initiated. I do think patient requires admission due to pneumonia PO therapy failure with abnormal vital signs. I have reviewed the patient home medicines and have made adjustments as needed.  Cardiac monitoring/EKG: The patient was maintained on a cardiac monitor.  I personally reviewed and interpreted the cardiac monitor which showed an underlying rhythm of: sinus rhythm.  Additional history obtained: External records from outside source obtained and reviewed including: Chart review including previous notes, labs, imaging.  Consultations obtained: I requested consultation with Dr. Lovena Neighbours, and discussed lab and imaging findings as well as pertinent plan.  He/she agrees to admit the patient.  Disposition Patient is admitted to the hospital.  This chart was dictated using voice recognition software.  Despite best efforts to proofread,  errors  can occur which can change the documentation meaning.          Final Clinical Impression(s) / ED Diagnoses Final diagnoses:  Pneumonia of both lower lobes due to infectious organism    Rx / DC Orders ED Discharge Orders     None         Rex Kras, [place] 11/15/22 1256    Tegeler, Gwenyth Allegra, MD 11/17/22 [phone]

## 2022-11-14 NOTE — ED Triage Notes (Signed)
Patient was diagnosed with pneumonia about 2 weeks ago and presents today with worsening symptoms. He now has fever and chills. He has been using Mucinex over the counter. He has rib pain that increases with inspiration as well as scrotum pain. He would not speak to EMS until he was in their truck. 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 Currently the patient is housed at the [facility], but his living condition causes anxiety.    EMS vitals: 122/76 BP 120 HR 20 RR 99% SPO2 on room air 113 CBG

## 2022-11-14 NOTE — H&P (Signed)
History and Physical    Ronnie Herman GGE:[id number] DOB: 04/02/1986 DOA: 11/14/2022  PCP: Marliss Coots, NP  Patient coming from: [place]  I have personally briefly reviewed patient's old medical records available.   Chief Complaint: Right-sided chest pain, shortness of breath with recent history of pneumonia.  HPI: Ronnie Herman is a 37 y.o. male with medical history significant of homelessness currently in [facility], bipolar 1 disorder not to continue treatment, drug abuse including meth use who presents today with right-sided pleuritic chest pain, shortness of breath ongoing almost for 2 weeks.  Patient was seen on 1/22 for lingual pneumonia and from ER with  doxycycline that he did not take.  He came back to ER on 1/28 where he had improved symptoms.  Presents back today again with a 1 week of shortness of breath, worse with exertion, chest pain mostly on the right lower chest, sweating and diaphoresis.  Constant chest pain worse with breathing, coughing and mobility. Did not record any fever at home. Patient has sputum which is dark, mostly in the morning. For the last 2 days he is not able to talk in full sentences because it hurts to talk.  He has been mobilizing around.  He tells me everybody in his shelter is sick with pneumonia. ED Course: On room air.  Tachycardic with heart rate up to 128, WBC count 25.4.  Chest x-ray showed multifocal pneumonia.  CT scan of the chest shows multifocal pneumonia more on right lower lobe.  Negative for PE.  Patient was given 1 L IV fluid in the ER.  Not started yet on antibiotics.  Admission called. COVID-19, influenza and RSV was negative.  Review of Systems: all systems are reviewed and pertinent positive as per HPI otherwise rest are negative.    Past Medical History:  Diagnosis Date   Bipolar 1 disorder ([place])    Drug abuse ([place])     History reviewed. No pertinent surgical history.  Social history   reports that he has never  smoked. He has never used smokeless tobacco. He reports that he does not currently use alcohol. He reports that he does not use drugs.  No Known Allergies  Family History  Problem Relation Age of Onset   Hypertension Mother    Diabetes Father      Prior to Admission medications   Medication Sig Start Date End Date Taking? Authorizing Provider  benzonatate (TESSALON) 100 MG capsule Take 1 capsule (100 mg total) by mouth every 8 (eight) hours. 10/30/22   Larene Pickett, PA-C  doxycycline (VIBRAMYCIN) 100 MG capsule Take 1 capsule (100 mg total) by mouth 2 (two) times daily. 10/30/22   Larene Pickett, PA-C    Physical Exam: Vitals:   11/14/22 1730 11/14/22 1731 11/14/22 1800 11/14/22 1930  BP: 108/63  110/70   Pulse: (!) 128  (!) 126 (!) 125  Resp: (!) 30  (!) 24   Temp:  98 F (36.7 C)    TempSrc:  Oral    SpO2: 97%  96% 95%    Constitutional: Anxious.  Sick looking.  Low-grade fever present.  He is whispering.  Not in any distress but anxious.  On room air. Vitals:   11/14/22 1730 11/14/22 1731 11/14/22 1800 11/14/22 1930  BP: 108/63  110/70   Pulse: (!) 128  (!) 126 (!) 125  Resp: (!) 30  (!) 24   Temp:  98 F (36.7 C)    TempSrc:  Oral  SpO2: 97%  96% 95%   Eyes: PERRL, lids and conjunctivae normal ENMT: Mucous membranes are dry.Posterior pharynx clear of any exudate or lesions.Normal dentition.  Neck: normal, supple, no masses, no thyromegaly Respiratory: Poor air entry mostly on the right base.  He is not taking deep breaths due to pleurisy.  Very poor inspiratory effort.  Normal respiratory effort at rest.. No accessory muscle use.  Cardiovascular: Regular rate and rhythm, no murmurs / rubs / gallops. No extremity edema. 2+ pedal pulses. No carotid bruits.  Tachycardic. Abdomen: no tenderness, no masses palpated. No hepatosplenomegaly. Bowel sounds positive.  Musculoskeletal: no clubbing / cyanosis. No joint deformity upper and lower extremities. Good ROM, no  contractures. Normal muscle tone.  Skin: no rashes, lesions, ulcers. No induration Neurologic: CN 2-12 grossly intact. Sensation intact, DTR normal. Strength 5/5 in all 4.  Psychiatric: Normal judgment and insight. Alert and oriented x 3.  Anxious.    Labs on Admission: I have personally reviewed following labs and imaging studies  CBC: Recent Labs  Lab 11/14/22 1412  WBC 25.4*  HGB 13.4  HCT 41.8  MCV 97.2  PLT 102   Basic Metabolic Panel: Recent Labs  Lab 11/14/22 1412  NA 134*  K 3.5  CL 95*  CO2 27  GLUCOSE 104*  BUN 15  CREATININE 1.19  CALCIUM 9.0   GFR: CrCl cannot be calculated (Unknown ideal weight.). Liver Function Tests: No results for input(s): "AST", "ALT", "ALKPHOS", "BILITOT", "PROT", "ALBUMIN" in the last 168 hours. No results for input(s): "LIPASE", "AMYLASE" in the last 168 hours. No results for input(s): "AMMONIA" in the last 168 hours. Coagulation Profile: No results for input(s): "INR", "PROTIME" in the last 168 hours. Cardiac Enzymes: No results for input(s): "CKTOTAL", "CKMB", "CKMBINDEX", "TROPONINI" in the last 168 hours. BNP (last 3 results) No results for input(s): "PROBNP" in the last 8760 hours. HbA1C: No results for input(s): "HGBA1C" in the last 72 hours. CBG: No results for input(s): "GLUCAP" in the last 168 hours. Lipid Profile: No results for input(s): "CHOL", "HDL", "LDLCALC", "TRIG", "CHOLHDL", "LDLDIRECT" in the last 72 hours. Thyroid Function Tests: No results for input(s): "TSH", "T4TOTAL", "FREET4", "T3FREE", "THYROIDAB" in the last 72 hours. Anemia Panel: No results for input(s): "VITAMINB12", "FOLATE", "FERRITIN", "TIBC", "IRON", "RETICCTPCT" in the last 72 hours. Urine analysis: No results found for: "COLORURINE", "APPEARANCEUR", "LABSPEC", "PHURINE", "GLUCOSEU", "HGBUR", "BILIRUBINUR", "KETONESUR", "PROTEINUR", "UROBILINOGEN", "NITRITE", "LEUKOCYTESUR"  Radiological Exams on Admission: CT Angio Chest PE W and/or Wo  Contrast  Result Date: 11/14/2022 CLINICAL DATA:  Fever and chills, pneumonia 2 weeks ago, rib pain with inspiration EXAM: CT ANGIOGRAPHY CHEST WITH CONTRAST TECHNIQUE: Multidetector CT imaging of the chest was performed using the standard protocol during bolus administration of intravenous contrast. Multiplanar CT image reconstructions and MIPs were obtained to evaluate the vascular anatomy. RADIATION DOSE REDUCTION: This exam was performed according to the departmental dose-optimization program which includes automated exposure control, adjustment of the mA and/or kV according to patient size and/or use of iterative reconstruction technique. CONTRAST:  16mL OMNIPAQUE IOHEXOL 350 MG/ML SOLN COMPARISON:  11/14/2022 FINDINGS: Cardiovascular: This is a technically adequate evaluation of the pulmonary vasculature. No filling defects or pulmonary emboli. The heart is unremarkable without pericardial effusion. No thoracic aortic aneurysm or dissection. Mediastinum/Nodes: No enlarged mediastinal, hilar, or axillary lymph nodes. Thyroid gland, trachea, and esophagus demonstrate no significant findings. Lungs/Pleura: There is multifocal bibasilar airspace disease, most pronounced within the right lower lobe. No evidence of cavitation. No effusion or pneumothorax. Central  airways are patent. Upper Abdomen: No acute abnormality. Musculoskeletal: No chest wall abnormality. No acute or significant osseous findings. Review of the MIP images confirms the above findings. IMPRESSION: 1. No evidence of pulmonary embolus. 2. Multifocal bibasilar pneumonia, most pronounced within the right lower lobe. This has progressed since the 11/04/2022 exam. Electronically Signed   By: Randa Ngo M.D.   On: 11/14/2022 18:34   DG Chest 2 View  Result Date: 11/14/2022 CLINICAL DATA:  Chest pain, recent pneumonia.  Fever and chills. EXAM: CHEST - 2 VIEW COMPARISON:  11/04/2022. FINDINGS: Trachea is midline. Heart size stable. Bibasilar  airspace opacities, increased on the right. No definite pleural fluid. IMPRESSION: Bibasilar airspace opacification, slightly increased on the right, indicative of pneumonia. Electronically Signed   By: Lorin Picket M.D.   On: 11/14/2022 14:33    EKG: Independently reviewed.  Sinus tachycardia.  QTc 407.  No acute ST /T wave changes.  Assessment/Plan Principal Problem:   Multifocal pneumonia Active Problems:   IVDU (intravenous drug user)   Bipolar disorder ([place])     1.  multifocal pneumonia, right more than left: Sepsis present on admission due to  tachycardia, WC count of 25,000 with suspected source of pneumonia.  Agree with admission given severity of symptoms. Antibiotics to treat bacterial pneumonia, will start patient on Rocephin and azithromycin after drawing blood cultures. Additional 1 L fluid bolus now and keep on maintenance IV fluids. Will use some pain medication Tylenol and tramadol for pain relief to improve his mobility and inspiratory effort so he can participate in chest physiotherapy. Chest physiotherapy, incentive spirometry, deep breathing exercises, sputum induction, mucolytic's and bronchodilators. Sputum cultures, blood cultures, Legionella and streptococcal antigen. Supplemental oxygen to keep saturations more than 90%. Check HIV. Speech therapy to evaluate.  2.  Drug use including marijuana and meth: Counseled.  Will provide patient low-dose of Ativan for anxiety.  3.  Bipolar 1 disorder: Not treated.  Denies any acute mental health issues.  DVT prophylaxis: Lovenox subcu Code Status: Full code Family Communication: None at the bedside Disposition Plan: Home when stable Consults called: None Admission status: Observation, telemetry.  Necessity for admission to the hospital will be evaluated every day.   Barb Merino MD [facility] Pager [phone]

## 2022-11-15 ENCOUNTER — Encounter (HOSPITAL_COMMUNITY): Payer: Self-pay | Admitting: Internal Medicine

## 2022-11-15 DIAGNOSIS — N50819 Testicular pain, unspecified: Secondary | ICD-10-CM | POA: Diagnosis present

## 2022-11-15 DIAGNOSIS — F1510 Other stimulant abuse, uncomplicated: Secondary | ICD-10-CM | POA: Diagnosis present

## 2022-11-15 DIAGNOSIS — J159 Unspecified bacterial pneumonia: Secondary | ICD-10-CM | POA: Diagnosis present

## 2022-11-15 DIAGNOSIS — E876 Hypokalemia: Secondary | ICD-10-CM | POA: Diagnosis present

## 2022-11-15 DIAGNOSIS — F319 Bipolar disorder, unspecified: Secondary | ICD-10-CM

## 2022-11-15 DIAGNOSIS — Z5901 Sheltered homelessness: Secondary | ICD-10-CM | POA: Diagnosis not present

## 2022-11-15 DIAGNOSIS — Z833 Family history of diabetes mellitus: Secondary | ICD-10-CM | POA: Diagnosis not present

## 2022-11-15 DIAGNOSIS — E872 Acidosis, unspecified: Secondary | ICD-10-CM

## 2022-11-15 DIAGNOSIS — F419 Anxiety disorder, unspecified: Secondary | ICD-10-CM | POA: Diagnosis present

## 2022-11-15 DIAGNOSIS — A419 Sepsis, unspecified organism: Secondary | ICD-10-CM | POA: Diagnosis present

## 2022-11-15 DIAGNOSIS — J189 Pneumonia, unspecified organism: Secondary | ICD-10-CM | POA: Diagnosis not present

## 2022-11-15 DIAGNOSIS — R0902 Hypoxemia: Secondary | ICD-10-CM | POA: Diagnosis present

## 2022-11-15 DIAGNOSIS — F1210 Cannabis abuse, uncomplicated: Secondary | ICD-10-CM | POA: Diagnosis present

## 2022-11-15 DIAGNOSIS — Z1152 Encounter for screening for COVID-19: Secondary | ICD-10-CM | POA: Diagnosis not present

## 2022-11-15 DIAGNOSIS — K047 Periapical abscess without sinus: Secondary | ICD-10-CM | POA: Diagnosis present

## 2022-11-15 DIAGNOSIS — Z8249 Family history of ischemic heart disease and other diseases of the circulatory system: Secondary | ICD-10-CM | POA: Diagnosis not present

## 2022-11-15 DIAGNOSIS — F1910 Other psychoactive substance abuse, uncomplicated: Secondary | ICD-10-CM

## 2022-11-15 DIAGNOSIS — E869 Volume depletion, unspecified: Secondary | ICD-10-CM | POA: Diagnosis present

## 2022-11-15 LAB — CBC WITH DIFFERENTIAL/PLATELET
Abs Immature Granulocytes: 0.75 10*3/uL — ABNORMAL HIGH (ref 0.00–0.07)
Basophils Absolute: 0.1 10*3/uL (ref 0.0–0.1)
Basophils Relative: 0 %
Eosinophils Absolute: 0 10*3/uL (ref 0.0–0.5)
Eosinophils Relative: 0 %
HCT: 32 % — ABNORMAL LOW (ref 39.0–52.0)
Hemoglobin: 10.2 g/dL — ABNORMAL LOW (ref 13.0–17.0)
Immature Granulocytes: 3 %
Lymphocytes Relative: 4 %
Lymphs Abs: 1 10*3/uL (ref 0.7–4.0)
MCH: 31.2 pg (ref 26.0–34.0)
MCHC: 31.9 g/dL (ref 30.0–36.0)
MCV: 97.9 fL (ref 80.0–100.0)
Monocytes Absolute: 1.6 10*3/uL — ABNORMAL HIGH (ref 0.1–1.0)
Monocytes Relative: 6 %
Neutro Abs: 21.9 10*3/uL — ABNORMAL HIGH (ref 1.7–7.7)
Neutrophils Relative %: 87 %
Platelets: 245 10*3/uL (ref 150–400)
RBC: 3.27 MIL/uL — ABNORMAL LOW (ref 4.22–5.81)
RDW: 13.5 % (ref 11.5–15.5)
WBC: 25.4 10*3/uL — ABNORMAL HIGH (ref 4.0–10.5)
nRBC: 0 % (ref 0.0–0.2)

## 2022-11-15 LAB — COMPREHENSIVE METABOLIC PANEL
ALT: 22 U/L (ref 0–44)
AST: 22 U/L (ref 15–41)
Albumin: 2.9 g/dL — ABNORMAL LOW (ref 3.5–5.0)
Alkaline Phosphatase: 68 U/L (ref 38–126)
Anion gap: 12 (ref 5–15)
BUN: 10 mg/dL (ref 6–20)
CO2: 22 mmol/L (ref 22–32)
Calcium: 7.9 mg/dL — ABNORMAL LOW (ref 8.9–10.3)
Chloride: 99 mmol/L (ref 98–111)
Creatinine, Ser: 0.73 mg/dL (ref 0.61–1.24)
GFR, Estimated: >60 mL/min (ref >60)
Glucose, Bld: 116 mg/dL — ABNORMAL HIGH (ref 70–99)
Potassium: 3.3 mmol/L — ABNORMAL LOW (ref 3.5–5.1)
Sodium: 133 mmol/L — ABNORMAL LOW (ref 135–145)
Total Bilirubin: 1.7 mg/dL — ABNORMAL HIGH (ref 0.3–1.2)
Total Protein: 5.9 g/dL — ABNORMAL LOW (ref 6.5–8.1)

## 2022-11-15 LAB — LACTIC ACID, PLASMA
Lactic Acid, Venous: 1.1 mmol/L (ref 0.5–1.9)
Lactic Acid, Venous: 3 mmol/L (ref 0.5–1.9)
Lactic Acid, Venous: 1.3 mmol/L (ref 0.5–1.9)

## 2022-11-15 LAB — PHOSPHORUS: Phosphorus: 3.3 mg/dL (ref 2.5–4.6)

## 2022-11-15 LAB — MAGNESIUM: Magnesium: 1.8 mg/dL (ref 1.7–2.4)

## 2022-11-15 LAB — MRSA NEXT GEN BY PCR, NASAL: MRSA by PCR Next Gen: NOT DETECTED

## 2022-11-15 LAB — HIV ANTIBODY (ROUTINE TESTING W REFLEX): HIV Screen 4th Generation wRfx: NONREACTIVE

## 2022-11-15 MED ORDER — METRONIDAZOLE 500 MG/100ML IV SOLN
500.0000 mg | Freq: Two times a day (BID) | INTRAVENOUS | Status: DC
  Administered 2022-11-15 – 2022-11-16 (×2): 500 mg via INTRAVENOUS
  Filled 2022-11-15 (×2): qty 100

## 2022-11-15 MED ORDER — POTASSIUM CHLORIDE CRYS ER 20 MEQ PO TBCR
40.0000 meq | EXTENDED_RELEASE_TABLET | Freq: Once | ORAL | Status: AC
  Administered 2022-11-15: 40 meq via ORAL
  Filled 2022-11-15: qty 2

## 2022-11-15 NOTE — Assessment & Plan Note (Signed)
Lactic acidosis likely secondary to sepsis with concurrent volume depletion Repeat lactic acid today reveals persisting mild lactic acidosis Hydrate patient intravenous isotonic fluids while concurrently treating underlying infection Monitoring serial lactic acid levels to ensure downtrending and resolution.  

## 2022-11-15 NOTE — Hospital Course (Addendum)
37 y.o. male with medical history significant of homelessness currently in [facility], bipolar 1 disorder not to continue treatment, drug abuse including meth use who presented to [facility] emergency department with complaints of right-sided pleuritic chest pain, shortness of breath ongoing almost for 2 weeks.   Upon evaluation in the emergency department patient was found to have multifocal pneumonia on chest x-ray with multiple SIRS criteria including tachycardia and markedly elevated white blood cell count all concerning for developing sepsis.  Patient was additionally found to be suffering from a lactic acidosis findings were confirmed via CT imaging of the chest.  The hospitalist group was then called to assess the patient for admission to the hospital.  Patient was treated with broad-spectrum intravenous antibiotic therapy including azithromycin, Flagyl and ceftriaxone.  Patient was aggressively hydrated with intravenous isotonic fluids.  In the days that followed patient experienced substantial clinical improvement with resolving SIRS criteria increasing strength and resolving shortness of breath.  Hospital course was complicated by bouts of right chest and flank pain felt to be pleuritic in nature due to the patient's substantial right lower lobe pneumonia.  This was managed with as needed analgesics.  Patient has a known history of polysubstance abuse including abuse of methamphetamines and marijuana.  Patient was counseled on cessation throughout the hospitalization.  Referral was placed for [facility] and assistance was provided to the patient including resources for local rehabilitation centers and shelters.  Patient was discharged in improved and stable condition on 11/16/2022 with a remaining course of oral antibiotics including Augmentin and azithromycin.  Patient was additionally provided with a short course of high-dose ibuprofen for his pleuritic chest pain.

## 2022-11-15 NOTE — Evaluation (Signed)
Clinical/Bedside Swallow Evaluation Patient Details  Name: Ronnie Herman MRN: [id number] Date of Birth: Feb 09, 1986  Today's Date: 11/15/2022 Time: SLP Start Time (ACUTE ONLY): 0948 SLP Stop Time (ACUTE ONLY): 1000 SLP Time Calculation (min) (ACUTE ONLY): 12 min  Past Medical History:  Past Medical History:  Diagnosis Date   Bipolar 1 disorder ([place])    Drug abuse ([place])    Past Surgical History: History reviewed. No pertinent surgical history. HPI:  Ronnie Herman is a 37 y.o. male with medical history significant of homelessness currently in [facility], bipolar 1 disorder not to continue treatment, drug abuse including meth use who presents today with right-sided pleuritic chest pain, shortness of breath ongoing almost for 2 weeks.  Patient was seen on 1/22 for lingual pneumonia and from ER with  doxycycline that he did not take.  He came back to ER on 1/28 where he had improved symptoms.  Presents back today again with a 1 week of shortness of breath, worse with exertion, chest pain mostly on the right lower chest, sweating and diaphoresis.  Constant chest pain worse with breathing, coughing and mobility.    Assessment / Plan / Recommendation  Clinical Impression  Pt presents with functional oropharyngeal swallow - No indication of aspiration across all po intake.  CN exam unremarkable -  Pt with timely swallow and easily passed 3 ounce yale water screen.    No oral retention across all po intake.  Recommend regular/thin diet and po as tolerated.  Pt reports issues with eructation - that are intense.  He also has posterior oral soft palate - erythema -and pt admits to discomfort with coughing at the back of the throat due to "vibration".  Pt observed to eat at very rapid pace and suspect this may contribute to his "air" issues.  Pt also reports choking twice on solids -in the past toward end of the meal, and he denies GERD.  Also advised pt use caution with substances due to increased aspiration  risk with central nervous system suppression.  Reviewed findings of evaluation. No SLP follow up indicated. SLP Visit Diagnosis: Dysphagia, unspecified (R13.10)    Aspiration Risk  No limitations    Diet Recommendation Regular;Thin liquid   Liquid Administration via: Cup Medication Administration: Whole meds with liquid Supervision: Patient able to self feed Compensations: Slow rate;Small sips/bites Postural Changes: Seated upright at 90 degrees;Remain upright for at least 30 minutes after po intake    Other  Recommendations Oral Care Recommendations: Oral care BID    Recommendations for follow up therapy are one component of a multi-disciplinary discharge planning process, led by the attending physician.  Recommendations may be updated based on patient status, additional functional criteria and insurance authorization.  Follow up Recommendations No SLP follow up      Assistance Recommended at Discharge  N/a  Functional Status Assessment Patient has not had a recent decline in their functional status  Frequency and Duration   N/a         Prognosis   N/a     Swallow Study   General Date of Onset: 11/15/22 HPI: Ronnie Herman is a 37 y.o. male with medical history significant of homelessness currently in [facility], bipolar 1 disorder not to continue treatment, drug abuse including meth use who presents today with right-sided pleuritic chest pain, shortness of breath ongoing almost for 2 weeks.  Patient was seen on 1/22 for lingual pneumonia and from ER with  doxycycline that he did not take.  He came back  to ER on 1/28 where he had improved symptoms.  Presents back today again with a 1 week of shortness of breath, worse with exertion, chest pain mostly on the right lower chest, sweating and diaphoresis.  Constant chest pain worse with breathing, coughing and mobility. Type of Study: Bedside Swallow Evaluation Diet Prior to this Study: Regular;Thin liquids (Level 0) Respiratory  Status: Room air History of Recent Intubation: No Behavior/Cognition: Alert;Cooperative;Pleasant mood Oral Cavity Assessment: Within Functional Limits Oral Care Completed by SLP: No Oral Cavity - Dentition: Adequate natural dentition Self-Feeding Abilities: Able to feed self Patient Positioning: Upright in bed Baseline Vocal Quality: Normal Volitional Cough: Strong Volitional Swallow: Able to elicit    Oral/Motor/Sensory Function Overall Oral Motor/Sensory Function: Within functional limits   Ice Chips Ice chips: Not tested   Thin Liquid Thin Liquid: Within functional limits Presentation: Cup;Straw    Nectar Thick Nectar Thick Liquid: Not tested   Honey Thick Honey Thick Liquid: Not tested   Puree Puree: Within functional limits Presentation: Self Fed;Spoon   Solid     Solid: Within functional limits Presentation: Self Fed;Spoon      Ronnie Herman 11/15/2022,10:58 AM  Kathleen Lime, MS [place] Office [phone]

## 2022-11-15 NOTE — Progress Notes (Signed)
   11/15/22 1542  Assess: MEWS Score  Temp 99.7 F (37.6 C)  BP 113/65  MAP (mmHg) 79  Pulse Rate (!) 116  Resp 18  SpO2 96 %  O2 Device Room Air  Assess: MEWS Score  MEWS Temp 0  MEWS Systolic 0  MEWS Pulse 2  MEWS RR 0  MEWS LOC 0  MEWS Score 2  MEWS Score Color Yellow  Assess: if the MEWS score is Yellow or Red  Were vital signs taken at a resting state? Yes  Focused Assessment No change from prior assessment  Does the patient meet 2 or more of the SIRS criteria? Yes  Does the patient have a confirmed or suspected source of infection? Yes  Provider and Rapid Response Notified? No  MEWS guidelines implemented  No, previously yellow, continue vital signs every 4 hours  Assess: SIRS CRITERIA  SIRS Temperature  0  SIRS Pulse 1  SIRS Respirations  0  SIRS WBC 1  SIRS Score Sum  2   Previously Red and Yellow MEWS, continue q 4 vitals

## 2022-11-15 NOTE — TOC Initial Note (Signed)
Transition of Care [facility]) - Initial/Assessment Note    Patient Details  Name: Ronnie Herman MRN: [id number] Date of Birth: 1985/10/20  [facility] CM/SW Contact:    Dessa Phi, RN Phone Number: 11/15/2022, 11:37 AM  Clinical Narrative: Patient doesn't want to return back to [facility]-provided w/homeless shelter resources.Bus pass @ d/c.                Expected Discharge Plan: Homeless Shelter Barriers to Discharge: Continued Medical Work up   Patient Goals and CMS Choice Patient states their goals for this hospitalization and ongoing recovery are::  (Homeless) CMS Medicare.gov Compare Post Acute Care list provided to:: Patient Choice offered to / list presented to : Patient [place] ownership interest in [facility].provided to:: Patient    Expected Discharge Plan and Services                                              Prior Living Arrangements/Services                       Activities of Daily Living Home Assistive Devices/Equipment: None ADL Screening (condition at time of admission) Patient's cognitive ability adequate to safely complete daily activities?: Yes Is the patient deaf or have difficulty hearing?: No Does the patient have difficulty seeing, even when wearing glasses/contacts?: No Does the patient have difficulty concentrating, remembering, or making decisions?: No Patient able to express need for assistance with ADLs?: Yes Does the patient have difficulty dressing or bathing?: No Independently performs ADLs?: Yes (appropriate for developmental age) Does the patient have difficulty walking or climbing stairs?: No Weakness of Legs: Both Weakness of Arms/Hands: None  Permission Sought/Granted                  Emotional Assessment              Admission diagnosis:  Multifocal pneumonia [J18.9] Patient Active Problem List   Diagnosis Date Noted   Sepsis due to pneumonia ([place]) 11/15/2022    Hypokalemia 11/15/2022   Pneumonia of both lower lobes due to infectious organism 11/14/2022   Polysubstance abuse ([place]) 11/14/2022   Bipolar 1 disorder ([place]) 11/14/2022   PCP:  Marliss Coots, NP Pharmacy:   [facility] [id number] - [address] [place] [address] Phone: [phone] Fax: [phone]     Social Determinants of Health (SDOH) Social History: SDOH Screenings   Food Insecurity: Food Insecurity Present (11/15/2022)  Housing: High Risk (11/15/2022)  Transportation Needs: Unmet Transportation Needs (11/15/2022)  Utilities: At Risk (11/15/2022)  Tobacco Use: Low Risk  (11/15/2022)   SDOH Interventions:     Readmission Risk Interventions     No data to display

## 2022-11-15 NOTE — Progress Notes (Signed)
PROGRESS NOTE   Ronnie Herman  BOF:[id number] DOB: 01-Aug-1986 DOA: 11/14/2022 PCP: Marliss Coots, NP   Date of Service: the patient was seen and examined on 11/15/2022  Brief Narrative:   37 y.o. male with medical history significant of homelessness currently in [facility], bipolar 1 disorder not to continue treatment, drug abuse including meth use who presented to [facility] emergency department with complaints of right-sided pleuritic chest pain, shortness of breath ongoing almost for 2 weeks.   Upon evaluation in the emergency department patient was found to have multifocal pneumonia on chest x-ray with multiple SIRS criteria including tachycardia and markedly elevated white blood cell count.  Findings were confirmed via CT imaging of the chest.  The hospitalist group was then called to assess the patient for admission to the hospital.   Assessment and Plan: * Pneumonia of both lower lobes due to infectious organism Broadening antibiotics to include anaerobic coverage.  Will add Flagyl considering concurrent severe dental caries concerning for odontogenic infection Aggressive intravenous volume resuscitation considering concurrent lactic acidosis Following blood cultures Strep pneumoniae testing negative, Legionella testing pending, MRSA PCR screen negative, COVID-19 PCR testing negative. Supplemental oxygen for bouts of hypoxia  Sepsis due to pneumonia [facility]) Please see assessment and plan above  Lactic acidosis Lactic acidosis likely secondary to sepsis with concurrent volume depletion Hydrate patient intravenous isotonic fluids while concurrently treating underlying infection Monitoring serial lactic acid levels to ensure downtrending and resolution.   Dental infection Notable substantial dental carry with associated infection in the left upper molars Intravenous Flagyl added to current antibiotic regimen to cover for anaerobic bacteria Outpatient dental  follow-up  Hypokalemia Replacing with potassium chloride Evaluating for concurrent hypomagnesemia  Monitoring potassium levels with serial chemistries.   Polysubstance abuse ([place]) Counseling on cessation daily Patient smokes methamphetamines regularly which likely has contributed to the patient's pneumonia TOC consultation placed for assistance with provision of resources to help patient stop using  Bipolar 1 disorder [facility]) Not currently receiving any treatment        Subjective:    Physical Exam:  Vitals:   11/15/22 0400 11/15/22 0809 11/15/22 1211 11/15/22 1542  BP: 110/76 104/71 123/70 113/65  Pulse: (!) 105 (!) 109 (!) 109 (!) 116  Resp: 19 20 20 18   Temp: 98.4 F (36.9 C) 98.2 F (36.8 C) 98.6 F (37 C) 99.7 F (37.6 C)  TempSrc: Oral Oral Oral Oral  SpO2: 97% 98% 100% 96%  Weight:      Height:        Constitutional: Awake alert and oriented x3, patient is somewhat tachypneic and in mild respiratory distress. Skin: no rashes, no lesions, good skin turgor noted. Eyes: Pupils are equally reactive to light.  No evidence of scleral icterus or conjunctival pallor.  ENMT: Moist mucous membranes noted.  Posterior pharynx clear of any exudate or lesions.   Respiratory: Notable bibasilar rales with mild intermittent expiratory wheezing.  Notably tachypneic with evidence of accessory muscle use.  Cardiovascular: Tachycardic rate with regular rhythm, no murmurs / rubs / gallops. No extremity edema. 2+ pedal pulses. No carotid bruits.  Abdomen: Abdomen is soft and nontender.  No evidence of intra-abdominal masses.  Positive bowel sounds noted in all quadrants.   Musculoskeletal: No joint deformity upper and lower extremities. Good ROM, no contractures. Normal muscle tone.    Data Reviewed:  I have personally reviewed and interpreted labs, imaging.  Significant findings are   CBC: Recent Labs  Lab 11/14/22 1412 11/15/22 0429  WBC 25.4* 25.4*  NEUTROABS  --   21.9*  HGB 13.4 10.2*  HCT 41.8 32.0*  MCV 97.2 97.9  PLT 325 193   Basic Metabolic Panel: Recent Labs  Lab 11/14/22 1412 11/15/22 0429  NA 134* 133*  K 3.5 3.3*  CL 95* 99  CO2 27 22  GLUCOSE 104* 116*  BUN 15 10  CREATININE 1.19 0.73  CALCIUM 9.0 7.9*  MG  --  1.8  PHOS  --  3.3   GFR: Estimated Creatinine Clearance: 137.4 mL/min (by C-G formula based on SCr of 0.73 mg/dL). Liver Function Tests: Recent Labs  Lab 11/15/22 0429  AST 22  ALT 22  ALKPHOS 68  BILITOT 1.7*  PROT 5.9*  ALBUMIN 2.9*    Code Status:  Full code.   Severity of Illness:  The appropriate patient status for this patient is INPATIENT. Inpatient status is judged to be reasonable and necessary in order to provide the required intensity of service to ensure the patient's safety. The patient's presenting symptoms, physical exam findings, and initial radiographic and laboratory data in the context of their chronic comorbidities is felt to place them at high risk for further clinical deterioration. Furthermore, it is not anticipated that the patient will be medically stable for discharge from the hospital within 2 midnights of admission.   * I certify that at the point of admission it is my clinical judgment that the patient will require inpatient hospital care spanning beyond 2 midnights from the point of admission due to high intensity of service, high risk for further deterioration and high frequency of surveillance required.*  Time spent:  56 minutes  Author:  Vernelle Emerald MD  11/15/2022 6:13 PM

## 2022-11-15 NOTE — Assessment & Plan Note (Signed)
·   Please see assessment and plan above °

## 2022-11-15 NOTE — Assessment & Plan Note (Signed)
· 

## 2022-11-15 NOTE — Assessment & Plan Note (Addendum)
Broadening antibiotics to include anaerobic coverage.  Will add Flagyl considering concurrent severe dental caries concerning for odontogenic infection Aggressive intravenous volume resuscitation considering concurrent lactic acidosis Following blood cultures Strep pneumoniae testing negative, Legionella testing pending, MRSA PCR screen negative, COVID-19 PCR testing negative. Supplemental oxygen for bouts of hypoxia

## 2022-11-15 NOTE — Assessment & Plan Note (Signed)
Counseling on cessation daily Patient smokes methamphetamines regularly which likely has contributed to the patient's pneumonia TOC consultation placed for assistance with provision of resources to help patient stop using

## 2022-11-15 NOTE — Assessment & Plan Note (Signed)
Notable substantial dental carry with associated infection in the left upper molars Intravenous Flagyl added to current antibiotic regimen to cover for anaerobic bacteria Outpatient dental follow-up

## 2022-11-15 NOTE — Assessment & Plan Note (Signed)
Not currently receiving any treatment

## 2022-11-16 ENCOUNTER — Other Ambulatory Visit (HOSPITAL_COMMUNITY): Payer: Self-pay

## 2022-11-16 LAB — COMPREHENSIVE METABOLIC PANEL
ALT: 15 U/L (ref 0–44)
AST: 13 U/L — ABNORMAL LOW (ref 15–41)
Albumin: 2.5 g/dL — ABNORMAL LOW (ref 3.5–5.0)
Alkaline Phosphatase: 57 U/L (ref 38–126)
Anion gap: 9 (ref 5–15)
BUN: 8 mg/dL (ref 6–20)
CO2: 23 mmol/L (ref 22–32)
Calcium: 7.8 mg/dL — ABNORMAL LOW (ref 8.9–10.3)
Chloride: 103 mmol/L (ref 98–111)
Creatinine, Ser: 0.75 mg/dL (ref 0.61–1.24)
GFR, Estimated: >60 mL/min (ref >60)
Glucose, Bld: 88 mg/dL (ref 70–99)
Potassium: 3.8 mmol/L (ref 3.5–5.1)
Sodium: 135 mmol/L (ref 135–145)
Total Bilirubin: 0.7 mg/dL (ref 0.3–1.2)
Total Protein: 6 g/dL — ABNORMAL LOW (ref 6.5–8.1)

## 2022-11-16 LAB — CBC WITH DIFFERENTIAL/PLATELET
Abs Immature Granulocytes: 0.11 10*3/uL — ABNORMAL HIGH (ref 0.00–0.07)
Basophils Absolute: 0 10*3/uL (ref 0.0–0.1)
Basophils Relative: 0 %
Eosinophils Absolute: 0.1 10*3/uL (ref 0.0–0.5)
Eosinophils Relative: 1 %
HCT: 30.5 % — ABNORMAL LOW (ref 39.0–52.0)
Hemoglobin: 9.7 g/dL — ABNORMAL LOW (ref 13.0–17.0)
Immature Granulocytes: 1 %
Lymphocytes Relative: 12 %
Lymphs Abs: 1.6 10*3/uL (ref 0.7–4.0)
MCH: 31.1 pg (ref 26.0–34.0)
MCHC: 31.8 g/dL (ref 30.0–36.0)
MCV: 97.8 fL (ref 80.0–100.0)
Monocytes Absolute: 1.4 10*3/uL — ABNORMAL HIGH (ref 0.1–1.0)
Monocytes Relative: 10 %
Neutro Abs: 10.6 10*3/uL — ABNORMAL HIGH (ref 1.7–7.7)
Neutrophils Relative %: 76 %
Platelets: 243 10*3/uL (ref 150–400)
RBC: 3.12 MIL/uL — ABNORMAL LOW (ref 4.22–5.81)
RDW: 13.5 % (ref 11.5–15.5)
WBC: 13.9 10*3/uL — ABNORMAL HIGH (ref 4.0–10.5)
nRBC: 0 % (ref 0.0–0.2)

## 2022-11-16 LAB — LEGIONELLA PNEUMOPHILA SEROGP 1 UR AG: L. pneumophila Serogp 1 Ur Ag: NEGATIVE

## 2022-11-16 LAB — MAGNESIUM: Magnesium: 2 mg/dL (ref 1.7–2.4)

## 2022-11-16 LAB — PROCALCITONIN: Procalcitonin: 4.93 ng/mL

## 2022-11-16 MED ORDER — AZITHROMYCIN 500 MG PO TABS
500.0000 mg | ORAL_TABLET | Freq: Every evening | ORAL | 0 refills | Status: AC
  Filled 2022-11-16: qty 3, 3d supply, fill #0

## 2022-11-16 MED ORDER — AMOXICILLIN-POT CLAVULANATE 875-125 MG PO TABS
1.0000 | ORAL_TABLET | Freq: Two times a day (BID) | ORAL | 0 refills | Status: AC
  Filled 2022-11-16: qty 11, 6d supply, fill #0

## 2022-11-16 MED ORDER — IBUPROFEN 800 MG PO TABS
800.0000 mg | ORAL_TABLET | Freq: Four times a day (QID) | ORAL | 0 refills | Status: DC | PRN
  Filled 2022-11-16: qty 30, 8d supply, fill #0

## 2022-11-16 NOTE — Discharge Instructions (Addendum)
Please take all prescribed occasions exactly as instructed including the entire course of your antibiotic therapy. Please abstain from further use of illicit drugs such as methamphetamines and marijuana Increase your activity level as tolerated Please follow-up with your primary care provider at [facility] family care in 1 to 2 weeks Please return to the emergency department if you develop recurrent fevers, shortness of breath, worsening chest pain or worsening weakness.

## 2022-11-16 NOTE — Discharge Summary (Signed)
Physician Discharge Summary   Patient: Ronnie Herman MRN: [id number] DOB: 11/30/1985  Admit date:     11/14/2022  Discharge date: 11/16/22  Discharge Physician: Vernelle Emerald   PCP: Marliss Coots, NP   Recommendations at discharge:   Patient has been to take his entire course of antibiotics for his pneumonia including Augmentin and azithromycin Patient is been counseled multiple times on cessation of all illicit substances including methamphetamines and marijuana Patient has been provided with resources for local shelters and rehabilitation centers  Discharge Diagnoses: Principal Problem:   Pneumonia of both lower lobes due to infectious organism Active Problems:   Sepsis due to pneumonia ([place])   Lactic acidosis   Dental infection   Hypokalemia   Polysubstance abuse ([place])   Bipolar 1 disorder ([place])  Resolved Problems:   * No resolved hospital problems. *   Hospital Course:  37 y.o. male with medical history significant of homelessness currently in [facility], bipolar 1 disorder not to continue treatment, drug abuse including meth use who presented to [facility] emergency department with complaints of right-sided pleuritic chest pain, shortness of breath ongoing almost for 2 weeks.   Upon evaluation in the emergency department patient was found to have multifocal pneumonia on chest x-ray with multiple SIRS criteria including tachycardia and markedly elevated white blood cell count all concerning for developing sepsis.  Patient was additionally found to be suffering from a lactic acidosis findings were confirmed via CT imaging of the chest.  The hospitalist group was then called to assess the patient for admission to the hospital.  Patient was treated with broad-spectrum intravenous antibiotic therapy including azithromycin, Flagyl and ceftriaxone.  Patient was aggressively hydrated with intravenous isotonic fluids.  In the days that followed patient experienced  substantial clinical improvement with resolving SIRS criteria increasing strength and resolving shortness of breath.  Hospital course was complicated by bouts of right chest and flank pain felt to be pleuritic in nature due to the patient's substantial right lower lobe pneumonia.  This was managed with as needed analgesics.  Patient has a known history of polysubstance abuse including abuse of methamphetamines and marijuana.  Patient was counseled on cessation throughout the hospitalization.  Referral was placed for [facility] and assistance was provided to the patient including resources for local rehabilitation centers and shelters.  Patient was discharged in improved and stable condition on 11/16/2022 with a remaining course of oral antibiotics including Augmentin and azithromycin.  Patient was additionally provided with a short course of high-dose ibuprofen for his pleuritic chest pain.         Consultants: None Procedures performed: None  Disposition: Home Diet recommendation:  Discharge Diet Orders (From admission, onward)     Start     Ordered   11/16/22 0000  Diet general        11/16/22 1113           Regular diet  DISCHARGE MEDICATION: Allergies as of 11/16/2022   No Known Allergies      Medication List     STOP taking these medications    benzonatate 100 MG capsule Commonly known as: TESSALON   doxycycline 100 MG capsule Commonly known as: VIBRAMYCIN       TAKE these medications    amoxicillin-clavulanate 875-125 MG tablet Commonly known as: AUGMENTIN Take 1 tablet by mouth 2 (two) times daily for 11 doses. Take first dose the evening of 2/9   azithromycin 500 MG tablet Commonly known as: Zithromax Take 1  tablet (500 mg total) by mouth every evening for 3 days. Take 1 tablet daily for 3 days.  Take your first dose the evening of 2/9.   ibuprofen 800 MG tablet Commonly known as: ADVIL Take 1 tablet (800 mg total) by mouth every 6 (six) hours as needed  (pain).        Follow-up Information     Placey, Audrea Muscat, NP. Schedule an appointment as soon as possible for a visit in 1 week(s).   Contact information: [address] [place] [address] [phone]                 Discharge Exam: Danley Danker Weights   11/14/22 2222 11/16/22 0500  Weight: 80.5 kg 80.2 kg    Constitutional: Awake alert and oriented x3, no associated distress.   Respiratory: Bibasilar rales, worst in the right base.  No evidence of wheezing.  Normal respiratory effort. No accessory muscle use.  Cardiovascular: Regular rate and rhythm, no murmurs / rubs / gallops. No extremity edema. 2+ pedal pulses. No carotid bruits.  Abdomen: Abdomen is soft and nontender.  No evidence of intra-abdominal masses.  Positive bowel sounds noted in all quadrants.   Musculoskeletal: No joint deformity upper and lower extremities. Good ROM, no contractures. Normal muscle tone.     Condition at discharge: fair  The results of significant diagnostics from this hospitalization (including imaging, microbiology, ancillary and laboratory) are listed below for reference.   Imaging Studies: CT Angio Chest PE W and/or Wo Contrast  Result Date: 11/14/2022 CLINICAL DATA:  Fever and chills, pneumonia 2 weeks ago, rib pain with inspiration EXAM: CT ANGIOGRAPHY CHEST WITH CONTRAST TECHNIQUE: Multidetector CT imaging of the chest was performed using the standard protocol during bolus administration of intravenous contrast. Multiplanar CT image reconstructions and MIPs were obtained to evaluate the vascular anatomy. RADIATION DOSE REDUCTION: This exam was performed according to the departmental dose-optimization program which includes automated exposure control, adjustment of the mA and/or kV according to patient size and/or use of iterative reconstruction technique. CONTRAST:  120m OMNIPAQUE IOHEXOL 350 MG/ML SOLN COMPARISON:  11/14/2022 FINDINGS: Cardiovascular: This is a technically  adequate evaluation of the pulmonary vasculature. No filling defects or pulmonary emboli. The heart is unremarkable without pericardial effusion. No thoracic aortic aneurysm or dissection. Mediastinum/Nodes: No enlarged mediastinal, hilar, or axillary lymph nodes. Thyroid gland, trachea, and esophagus demonstrate no significant findings. Lungs/Pleura: There is multifocal bibasilar airspace disease, most pronounced within the right lower lobe. No evidence of cavitation. No effusion or pneumothorax. Central airways are patent. Upper Abdomen: No acute abnormality. Musculoskeletal: No chest wall abnormality. No acute or significant osseous findings. Review of the MIP images confirms the above findings. IMPRESSION: 1. No evidence of pulmonary embolus. 2. Multifocal bibasilar pneumonia, most pronounced within the right lower lobe. This has progressed since the 11/04/2022 exam. Electronically Signed   By: MRanda NgoM.D.   On: 11/14/2022 18:34   DG Chest 2 View  Result Date: 11/14/2022 CLINICAL DATA:  Chest pain, recent pneumonia.  Fever and chills. EXAM: CHEST - 2 VIEW COMPARISON:  11/04/2022. FINDINGS: Trachea is midline. Heart size stable. Bibasilar airspace opacities, increased on the right. No definite pleural fluid. IMPRESSION: Bibasilar airspace opacification, slightly increased on the right, indicative of pneumonia. Electronically Signed   By: MLorin PicketM.D.   On: 11/14/2022 14:33   DG Chest 2 View  Result Date: 11/04/2022 CLINICAL DATA:  Slightly productive cough for the past week, diagnosed pneumonia. EXAM: CHEST -  2 VIEW COMPARISON:  10/29/2022 FINDINGS: Normal sized heart. No significant change in the previously demonstrated airspace opacity in the lingula. The remainder of the lungs are clear. Mild scoliosis. IMPRESSION: Stable probable lingular pneumonia. Followup PA and lateral chest X-ray is recommended in 3-4 weeks following trial of antibiotic therapy to ensure resolution and exclude  underlying malignancy. Electronically Signed   By: Claudie Revering M.D.   On: 11/04/2022 13:11   DG Chest 2 View  Result Date: 10/29/2022 CLINICAL DATA:  Shortness of breath EXAM: CHEST - 2 VIEW COMPARISON:  None Available. FINDINGS: No pneumothorax, effusion or edema. Normal cardiopericardial silhouette. There is focal infiltrate along the lingula. Possible pneumonia. Recommend follow-up to confirm clearance. IMPRESSION: Subtle lingular infiltrate. Possible pneumonia. Recommend follow-up to confirm clearance. Electronically Signed   By: Jill Side M.D.   On: 10/29/2022 18:06    Microbiology: Results for orders placed or performed during the hospital encounter of 11/14/22  Resp panel by RT-PCR (RSV, Flu A&B, Covid) Anterior Nasal Swab     Status: None   Collection Time: 11/14/22  2:50 PM   Specimen: Anterior Nasal Swab  Result Value Ref Range Status   SARS Coronavirus 2 by RT PCR NEGATIVE NEGATIVE Final    Comment: (NOTE) SARS-CoV-2 target nucleic acids are NOT DETECTED.  The SARS-CoV-2 RNA is generally detectable in upper respiratory specimens during the acute phase of infection. The lowest concentration of SARS-CoV-2 viral copies this assay can detect is 138 copies/mL. A negative result does not preclude SARS-Cov-2 infection and should not be used as the sole basis for treatment or other patient management decisions. A negative result may occur with  improper specimen collection/handling, submission of specimen other than nasopharyngeal swab, presence of viral mutation(s) within the areas targeted by this assay, and inadequate number of viral copies(<138 copies/mL). A negative result must be combined with clinical observations, patient history, and epidemiological information. The expected result is Negative.  Fact Sheet for Patients:  EntrepreneurPulse.com.au  Fact Sheet for Healthcare Providers:  IncredibleEmployment.be  This test is no t yet  approved or cleared by the [place] FDA and  has been authorized for detection and/or diagnosis of SARS-CoV-2 by FDA under an Emergency Use Authorization (EUA). This EUA will remain  in effect (meaning this test can be used) for the duration of the COVID-19 declaration under Section 564(b)(1) of the Act, 21 U.S.C.section 360bbb-3(b)(1), unless the authorization is terminated  or revoked sooner.       Influenza A by PCR NEGATIVE NEGATIVE Final   Influenza B by PCR NEGATIVE NEGATIVE Final    Comment: (NOTE) The Xpert Xpress SARS-CoV-2/FLU/RSV plus assay is intended as an aid in the diagnosis of influenza from Nasopharyngeal swab specimens and should not be used as a sole basis for treatment. Nasal washings and aspirates are unacceptable for Xpert Xpress SARS-CoV-2/FLU/RSV testing.  Fact Sheet for Patients: EntrepreneurPulse.com.au  Fact Sheet for Healthcare Providers: IncredibleEmployment.be  This test is not yet approved or cleared by the [place] FDA and has been authorized for detection and/or diagnosis of SARS-CoV-2 by FDA under an Emergency Use Authorization (EUA). This EUA will remain in effect (meaning this test can be used) for the duration of the COVID-19 declaration under Section 564(b)(1) of the Act, 21 U.S.C. section 360bbb-3(b)(1), unless the authorization is terminated or revoked.     Resp Syncytial Virus by PCR NEGATIVE NEGATIVE Final    Comment: (NOTE) Fact Sheet for Patients: EntrepreneurPulse.com.au  Fact Sheet for Healthcare Providers: IncredibleEmployment.be  This test is not yet approved or cleared by the [place] and has been authorized for detection and/or diagnosis of SARS-CoV-2 by FDA under an Emergency Use Authorization (EUA). This EUA will remain in effect (meaning this test can be used) for the duration of the COVID-19 declaration under Section 564(b)(1) of  the Act, 21 U.S.C. section 360bbb-3(b)(1), unless the authorization is terminated or revoked.  Performed at [facility], [place] [address] [place], [place] [address]   Culture, blood (routine x 2) Call MD if unable to obtain prior to antibiotics being given     Status: None (Preliminary result)   Collection Time: 11/14/22  8:25 PM   Specimen: BLOOD  Result Value Ref Range Status   Specimen Description   Final    BLOOD RIGHT ANTECUBITAL Performed at [place] [address] [place], [place] [address]    Special Requests   Final    BOTTLES DRAWN AEROBIC AND ANAEROBIC Blood Culture adequate volume Performed at [place] [address] [place], [place] [address]    Culture   Final    NO GROWTH 2 DAYS Performed at [place] [address] [place], [place] [address]    Report Status PENDING  Incomplete  Culture, blood (routine x 2) Call MD if unable to obtain prior to antibiotics being given     Status: None (Preliminary result)   Collection Time: 11/14/22 10:43 PM   Specimen: BLOOD  Result Value Ref Range Status   Specimen Description   Final    BLOOD BLOOD RIGHT HAND Performed at [place] [address], [place], [place] [address]    Special Requests   Final    BOTTLES DRAWN AEROBIC AND ANAEROBIC Blood Culture adequate volume Performed at [place] [address], [place], [place] [address]    Culture   Final    NO GROWTH 1 DAY Performed at [facility], [place] [address], [place], [place] [address]    Report Status PENDING  Incomplete  MRSA Next Gen by PCR, Nasal     Status: None   Collection Time: 11/15/22  9:06 AM   Specimen: Nasal Mucosa; Nasal Swab  Result Value Ref Range Status   MRSA by PCR Next Gen NOT DETECTED NOT DETECTED Final    Comment: (NOTE) The GeneXpert MRSA Assay (FDA approved for NASAL specimens only), is one component of a  comprehensive MRSA colonization surveillance program. It is not intended to diagnose MRSA infection nor to guide or monitor treatment for MRSA infections. Test performance is not FDA approved in patients less than 69 years old. Performed at [facility], [place] [address] [place], [place] [address]     Labs: CBC: Recent Labs  Lab 11/14/22 1412 11/15/22 0429 11/16/22 0354  WBC 25.4* 25.4* 13.9*  NEUTROABS  --  21.9* 10.6*  HGB 13.4 10.2* 9.7*  HCT 41.8 32.0* 30.5*  MCV 97.2 97.9 97.8  PLT 325 245 0000000   Basic Metabolic Panel: Recent Labs  Lab 11/14/22 1412 11/15/22 0429 11/16/22 0354  NA 134* 133* 135  K 3.5 3.3* 3.8  CL 95* 99 103  CO2 27 22 23  $ GLUCOSE 104* 116* 88  BUN 15 10 8  $ CREATININE 1.19 0.73 0.75  CALCIUM 9.0 7.9* 7.8*  MG  --  1.8 2.0  PHOS  --  3.3  --    Liver Function Tests: Recent Labs  Lab  11/15/22 0429 11/16/22 0354  AST 22 13*  ALT 22 15  ALKPHOS 68 57  BILITOT 1.7* 0.7  PROT 5.9* 6.0*  ALBUMIN 2.9* 2.5*   CBG: No results for input(s): "GLUCAP" in the last 168 hours.  Discharge time spent: greater than 30 minutes.  Signed: Vernelle Emerald, MD [facility] 11/16/2022

## 2022-11-16 NOTE — Progress Notes (Signed)
SATURATION QUALIFICATIONS: (This note is used to comply with regulatory documentation for home oxygen)  Patient Saturations on Room Air at Rest = 97%  Patient Saturations on Room Air while Ambulating = 95%  Please briefly explain why patient needs home oxygen: Pt does not need home O2.

## 2022-11-16 NOTE — TOC Progression Note (Signed)
Transition of Care [facility]) - Progression Note    Patient Details  Name: Ronnie Herman MRN: [id number] Date of Birth: 09/24/1986  Transition of Care [facility]) CM/SW Contact  Ronnie Herman, Ronnie Pulse, RN Phone Number: 11/16/2022, 12:16 PM  Clinical Narrative: Patient has $3 co pay for each qualified med. Nsg has bus pass. [place] for pcp-can make own appt. Resources for SA on AVS. No further CM needs.    [place] Medication Assistance Card  Name: Takota Jacquez ID (MRN): [id number] [place]: [id number] RX Group: [id number] Discharge Date: 11/16/22 Expiration Date:11/23/22                                          (must be filled within 7 days of discharge)     You have been approved to have the prescriptions written by your discharging physician filled through our [facility] (Medication Assistance Through [facility]) program. This program allows for a one-time (no refills) 34-day supply of selected medications for a low copay amount.  The copay is $3.00 per prescription. For instance, if you have one prescription, you will pay $3.00; for two prescriptions, you pay $6.00; for three prescriptions, you pay $9.00; and so on.  Only certain pharmacies are participating in this program with [facility] You will need to select one of the pharmacies from the attached list and take your prescriptions, this letter, and your photo ID to one of the [place] pharmacies, [facility], [facility] at [address] or [facility] [id number].   We are excited that you are able to use the [facility] program to get your medications. These prescriptions must be filled within 7 days of hospital discharge or they will no longer be valid for the [facility] program. Should you have any problems with your prescriptions please contact your case management team member at [phone] for [place]/ [place] you, [place],  [place] Management Nurse Case Manager/ [facility] Dept Email: [email or web address]     Expected Discharge Plan: Homeless Shelter Barriers to Discharge: No Barriers Identified  Expected Discharge Plan and Services         Expected Discharge Date: 11/16/22                                     Social Determinants of Health (SDOH) Interventions SDOH Screenings   Food Insecurity: Food Insecurity Present (11/15/2022)  Housing: High Risk (11/15/2022)  Transportation Needs: Unmet Transportation Needs (11/15/2022)  Utilities: At Risk (11/15/2022)  Tobacco Use: Low Risk  (11/15/2022)    Readmission Risk Interventions     No data to display

## 2022-11-19 ENCOUNTER — Other Ambulatory Visit (HOSPITAL_COMMUNITY): Payer: Self-pay

## 2022-11-19 LAB — CULTURE, BLOOD (ROUTINE X 2)
Culture: NO GROWTH
Special Requests: ADEQUATE

## 2022-11-20 LAB — CULTURE, BLOOD (ROUTINE X 2)
Culture: NO GROWTH
Special Requests: ADEQUATE

## 2022-11-22 ENCOUNTER — Other Ambulatory Visit (HOSPITAL_COMMUNITY): Payer: Self-pay

## 2023-01-07 ENCOUNTER — Other Ambulatory Visit (HOSPITAL_COMMUNITY)
Admission: EM | Admit: 2023-01-07 | Discharge: 2023-01-14 | Disposition: A | Discharge status: Home / Self Care | Payer: 59 | Attending: Psychiatry | Admitting: Psychiatry

## 2023-01-07 DIAGNOSIS — F319 Bipolar disorder, unspecified: Secondary | ICD-10-CM | POA: Diagnosis not present

## 2023-01-07 DIAGNOSIS — F1490 Cocaine use, unspecified, uncomplicated: Secondary | ICD-10-CM | POA: Diagnosis not present

## 2023-01-07 DIAGNOSIS — F1590 Other stimulant use, unspecified, uncomplicated: Secondary | ICD-10-CM | POA: Insufficient documentation

## 2023-01-07 DIAGNOSIS — Z5900 Homelessness unspecified: Secondary | ICD-10-CM | POA: Insufficient documentation

## 2023-01-07 DIAGNOSIS — F1910 Other psychoactive substance abuse, uncomplicated: Secondary | ICD-10-CM | POA: Diagnosis not present

## 2023-01-07 DIAGNOSIS — Z1152 Encounter for screening for COVID-19: Secondary | ICD-10-CM | POA: Insufficient documentation

## 2023-01-07 DIAGNOSIS — R45851 Suicidal ideations: Secondary | ICD-10-CM | POA: Insufficient documentation

## 2023-01-07 DIAGNOSIS — F17200 Nicotine dependence, unspecified, uncomplicated: Secondary | ICD-10-CM | POA: Insufficient documentation

## 2023-01-07 LAB — CBC WITH DIFFERENTIAL/PLATELET
Abs Immature Granulocytes: 0.01 10*3/uL (ref 0.00–0.07)
Basophils Absolute: 0 10*3/uL (ref 0.0–0.1)
Basophils Relative: 1 %
Eosinophils Absolute: 0.1 10*3/uL (ref 0.0–0.5)
Eosinophils Relative: 2 %
HCT: 43.4 % (ref 39.0–52.0)
Hemoglobin: 13.5 g/dL (ref 13.0–17.0)
Immature Granulocytes: 0 %
Lymphocytes Relative: 25 %
Lymphs Abs: 1.5 10*3/uL (ref 0.7–4.0)
MCH: 30.1 pg (ref 26.0–34.0)
MCHC: 31.1 g/dL (ref 30.0–36.0)
MCV: 96.9 fL (ref 80.0–100.0)
Monocytes Absolute: 0.4 10*3/uL (ref 0.1–1.0)
Monocytes Relative: 7 %
Neutro Abs: 4.1 10*3/uL (ref 1.7–7.7)
Neutrophils Relative %: 65 %
Platelets: 289 10*3/uL (ref 150–400)
RBC: 4.48 MIL/uL (ref 4.22–5.81)
RDW: 13.2 % (ref 11.5–15.5)
WBC: 6.2 10*3/uL (ref 4.0–10.5)
nRBC: 0 % (ref 0.0–0.2)

## 2023-01-07 LAB — POCT URINE DRUG SCREEN - MANUAL ENTRY (I-SCREEN)
POC Amphetamine UR: NOT DETECTED
POC Buprenorphine (BUP): NOT DETECTED
POC Cocaine UR: POSITIVE — AB
POC Marijuana UR: POSITIVE — AB
POC Methadone UR: NOT DETECTED
POC Methamphetamine UR: POSITIVE — AB
POC Morphine: NOT DETECTED
POC Oxazepam (BZO): NOT DETECTED
POC Oxycodone UR: NOT DETECTED
POC Secobarbital (BAR): NOT DETECTED

## 2023-01-07 LAB — LIPID PANEL
Cholesterol: 184 mg/dL (ref 0–200)
HDL: 56 mg/dL (ref >40)
LDL Cholesterol: 98 mg/dL (ref 0–99)
Total CHOL/HDL Ratio: 3.3 RATIO
Triglycerides: 151 mg/dL — ABNORMAL HIGH (ref <150)
VLDL: 30 mg/dL (ref 0–40)

## 2023-01-07 LAB — URINALYSIS, ROUTINE W REFLEX MICROSCOPIC
Bilirubin Urine: NEGATIVE
Glucose, UA: NEGATIVE mg/dL
Hgb urine dipstick: NEGATIVE
Ketones, ur: 5 mg/dL — AB
Leukocytes,Ua: NEGATIVE
Nitrite: NEGATIVE
Protein, ur: NEGATIVE mg/dL
Specific Gravity, Urine: 1.026 (ref 1.005–1.030)
pH: 5 (ref 5.0–8.0)

## 2023-01-07 LAB — COMPREHENSIVE METABOLIC PANEL
ALT: 17 U/L (ref 0–44)
AST: 19 U/L (ref 15–41)
Albumin: 3.8 g/dL (ref 3.5–5.0)
Alkaline Phosphatase: 76 U/L (ref 38–126)
Anion gap: 10 (ref 5–15)
BUN: 12 mg/dL (ref 6–20)
CO2: 28 mmol/L (ref 22–32)
Calcium: 9.3 mg/dL (ref 8.9–10.3)
Chloride: 100 mmol/L (ref 98–111)
Creatinine, Ser: 0.86 mg/dL (ref 0.61–1.24)
GFR, Estimated: >60 mL/min (ref >60)
Glucose, Bld: 121 mg/dL — ABNORMAL HIGH (ref 70–99)
Potassium: 4.1 mmol/L (ref 3.5–5.1)
Sodium: 138 mmol/L (ref 135–145)
Total Bilirubin: 0.6 mg/dL (ref 0.3–1.2)
Total Protein: 6.3 g/dL — ABNORMAL LOW (ref 6.5–8.1)

## 2023-01-07 LAB — MAGNESIUM: Magnesium: 2.2 mg/dL (ref 1.7–2.4)

## 2023-01-07 LAB — HIV ANTIBODY (ROUTINE TESTING W REFLEX): HIV Screen 4th Generation wRfx: NONREACTIVE

## 2023-01-07 LAB — SARS CORONAVIRUS 2 BY RT PCR: SARS Coronavirus 2 by RT PCR: NEGATIVE

## 2023-01-07 LAB — ETHANOL: Alcohol, Ethyl (B): <10 mg/dL (ref <10)

## 2023-01-07 LAB — TSH: TSH: 0.336 u[IU]/mL — ABNORMAL LOW (ref 0.350–4.500)

## 2023-01-07 LAB — POC SARS CORONAVIRUS 2 AG: SARSCOV2ONAVIRUS 2 AG: NEGATIVE

## 2023-01-07 MED ORDER — HYDROXYZINE HCL 25 MG PO TABS: 25.0000 mg | ORAL_TABLET | Freq: Three times a day (TID) | ORAL | Status: DC | PRN

## 2023-01-07 MED ORDER — TRAZODONE HCL 50 MG PO TABS
50.0000 mg | ORAL_TABLET | Freq: Every evening | ORAL | Status: DC | PRN
  Administered 2023-01-08 – 2023-01-09 (×2): 50 mg via ORAL
  Filled 2023-01-07 (×2): qty 1

## 2023-01-07 MED ORDER — ALUM & MAG HYDROXIDE-SIMETH 200-200-20 MG/5ML PO SUSP
30.0000 mL | ORAL | Status: DC | PRN
  Administered 2023-01-11: 30 mL via ORAL
  Filled 2023-01-07: qty 30

## 2023-01-07 MED ORDER — MAGNESIUM HYDROXIDE 400 MG/5ML PO SUSP
30.0000 mL | Freq: Every day | ORAL | Status: DC | PRN
  Administered 2023-01-09 – 2023-01-12 (×2): 30 mL via ORAL
  Filled 2023-01-07 (×2): qty 30

## 2023-01-07 MED ORDER — ACETAMINOPHEN 325 MG PO TABS: 650.0000 mg | ORAL_TABLET | Freq: Four times a day (QID) | ORAL | Status: DC | PRN

## 2023-01-07 NOTE — Progress Notes (Signed)
Received Ronnie Herman from the assessment room. He was cooperative with the admission process and oriented to his new environment.He endorsed feeling anxious and denied all other psychiatric systems.

## 2023-01-07 NOTE — ED Provider Notes (Signed)
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 11/16/2022 10.6 (H)  1.7 - 7.7 K/uL Final   Lymphocytes Relative 11/16/2022 12  % Final   Lymphs Abs 11/16/2022 1.6  0.7 - 4.0 K/uL Final   Monocytes Relative 11/16/2022 10  % Final   Monocytes Absolute 11/16/2022 1.4 (H)  0.1 - 1.0 K/uL Final   Eosinophils Relative 11/16/2022 1  % Final   Eosinophils Absolute 11/16/2022 0.1  0.0 - 0.5 K/uL Final   Basophils Relative 11/16/2022 0  % Final   Basophils Absolute 11/16/2022 0.0  0.0 - 0.1 K/uL Final   Immature Granulocytes 11/16/2022 1  % Final   Abs Immature Granulocytes 11/16/2022 0.11 (H)  0.00 - 0.07 K/uL Final   Performed at [facility], [place] [address], [place], [place] [address]   Sodium 11/16/2022 135  135 - 145 mmol/L Final   Potassium 11/16/2022 3.8  3.5 - 5.1 mmol/L Final   Chloride 11/16/2022 103  98 - 111 mmol/L Final   CO2 11/16/2022 23  22 - 32 mmol/L Final   Glucose, Bld 11/16/2022 88  70 - 99 mg/dL Final   Glucose reference range applies only to samples taken after fasting for at least 8 hours.   BUN 11/16/2022 8  6 - 20 mg/dL Final   Creatinine, Ser 11/16/2022 0.75  0.61 - 1.24 mg/dL Final   Calcium 11/16/2022 7.8 (L)  8.9 - 10.3 mg/dL Final   Total Protein 11/16/2022 6.0 (L)  6.5 - 8.1 g/dL Final   Albumin 11/16/2022 2.5 (L)  3.5 - 5.0 g/dL Final   AST 11/16/2022 13 (L)  15 - 41 U/L Final   ALT 11/16/2022 15  0 - 44 U/L Final   Alkaline Phosphatase 11/16/2022 57  38 - 126 U/L Final   Total Bilirubin 11/16/2022 0.7  0.3 - 1.2 mg/dL Final   GFR, Estimated 11/16/2022 >60  >60 mL/min Final   Comment: (NOTE) Calculated using the CKD-EPI Creatinine Equation (2021)    Anion gap 11/16/2022 9  5 - 15 Final   Performed at [facility], [place] [address], [place], [place] [address]   Magnesium 11/16/2022 2.0  1.7 - 2.4 mg/dL Final   Performed at [facility] [address], [place], [place] [address]    Procalcitonin 11/16/2022 4.93  ng/mL Final   Comment:        Interpretation: PCT > 2 ng/mL: Systemic infection (sepsis) is likely, unless other causes are known. (NOTE)       Sepsis PCT Algorithm           Lower Respiratory Tract                                      Infection PCT Algorithm    ----------------------------     ----------------------------         PCT < 0.25 ng/mL                PCT < 0.10 ng/mL          Strongly encourage             Strongly discourage   discontinuation of antibiotics    initiation of antibiotics    ----------------------------     -----------------------------       PCT 0.25 - 0.50 ng/mL            PCT 0.10 - 0.25 ng/mL               OR       >80% decrease in PCT            Discourage initiation of                                            antibiotics      Encourage discontinuation           of antibiotics    ----------------------------     -----------------------------         PCT >= 0.50 ng/mL              PCT 0.26 - 0.50 ng/mL  AND       <80% decrease in PCT                                       Encourage initiation of                                             antibiotics       Encourage continuation           of antibiotics    ----------------------------     -----------------------------        PCT >= 0.50 ng/mL                  PCT > 0.50 ng/mL               AND         increase in PCT                  Strongly encourage                                      initiation of antibiotics    Strongly encourage escalation           of antibiotics                                     -----------------------------                                           PCT <= 0.25 ng/mL                                                 OR                                        > 80% decrease in PCT                                      Discontinue / Do not initiate                                             antibiotics  Performed at [facility] [address], [place], [place] [address]   Admission on 10/29/2022, Discharged on 10/30/2022  Component Date Value Ref Range Status   SARS Coronavirus 2 by RT PCR 10/29/2022 NEGATIVE  NEGATIVE Final   Comment: (NOTE) SARS-CoV-2 target nucleic acids are NOT DETECTED.  The SARS-CoV-2 RNA is generally detectable in upper respiratory specimens during the acute phase of infection. The lowest concentration of SARS-CoV-2  viral copies this assay can detect is 138 copies/mL. A negative result does not preclude SARS-Cov-2 infection and should not be used as the sole basis for treatment or other patient management decisions. A negative result may occur with  improper specimen collection/handling, submission of specimen other than nasopharyngeal swab, presence of viral mutation(s) within the areas targeted by this assay, and inadequate number of viral copies(<138 copies/mL). A negative result must be combined with clinical observations, patient history, and epidemiological information. The expected result is Negative.  Fact Sheet for Patients:  EntrepreneurPulse.com.au  Fact Sheet for Healthcare Providers:  IncredibleEmployment.be  This test is no                          t yet approved or cleared by the [place] FDA and  has been authorized for detection and/or diagnosis of SARS-CoV-2 by FDA under an Emergency Use Authorization (EUA). This EUA will remain  in effect (meaning this test can be used) for the duration of the COVID-19 declaration under Section 564(b)(1) of the Act, 21 U.S.C.section 360bbb-3(b)(1), unless the authorization is terminated  or revoked sooner.       Influenza A by PCR 10/29/2022 NEGATIVE  NEGATIVE Final   Influenza B by PCR 10/29/2022 NEGATIVE  NEGATIVE Final   Comment: (NOTE) The Xpert Xpress SARS-CoV-2/FLU/RSV plus assay is intended as an aid in the diagnosis of influenza from Nasopharyngeal  swab specimens and should not be used as a sole basis for treatment. Nasal washings and aspirates are unacceptable for Xpert Xpress SARS-CoV-2/FLU/RSV testing.  Fact Sheet for Patients: EntrepreneurPulse.com.au  Fact Sheet for Healthcare Providers: IncredibleEmployment.be  This test is not yet approved or cleared by the [place] FDA and has been authorized for detection and/or diagnosis of SARS-CoV-2 by FDA under an Emergency Use Authorization (EUA). This EUA will remain in effect (meaning this test can be used) for the duration of the COVID-19 declaration under Section 564(b)(1) of the Act, 21 U.S.C. section 360bbb-3(b)(1), unless the authorization is terminated or revoked.     Resp Syncytial Virus by PCR 10/29/2022 NEGATIVE  NEGATIVE Final   Comment: (NOTE) Fact Sheet for Patients: EntrepreneurPulse.com.au  Fact Sheet for Healthcare Providers: IncredibleEmployment.be  This test is not yet approved or cleared by the [place] FDA and has been authorized for detection and/or diagnosis of SARS-CoV-2 by FDA under an Emergency Use Authorization (EUA). This EUA will remain in effect (meaning this test can be used) for the duration of the COVID-19 declaration under Section 564(b)(1) of the Act, 21 U.S.C. section 360bbb-3(b)(1), unless the authorization is terminated or revoked.  Performed at [facility], [place] [address] [place], [place] [address]    Group A Strep by PCR 10/29/2022 NOT DETECTED  NOT DETECTED Final   Performed at [facility] [address], [place], [place] [address]    Allergies: Patient has no known allergies.  Medications:  Facility Ordered Medications  Medication   acetaminophen (TYLENOL) tablet 650 mg   alum & mag hydroxide-simeth (MAALOX/MYLANTA) 200-200-20 MG/5ML suspension 30 mL   magnesium hydroxide (MILK OF MAGNESIA) suspension  30 mL   hydrOXYzine (ATARAX) tablet 25 mg   traZODone (DESYREL) tablet 50 mg   PTA Medications  Medication Sig   ibuprofen (ADVIL) 800 MG tablet Take 1 tablet (800 mg total) by mouth every 6 (six) hours as needed (pain).    Long Term Goals: Improvement in symptoms so as ready for discharge  Short Term Goals: Patient will verbalize feelings in meetings with treatment team members., Patient will attend at least of 50% of the groups daily., Pt will complete the PHQ9 on admission, day 3 and discharge., Patient will participate in completing the [place], Patient will score a low risk of violence for 24 hours prior to discharge, and Patient will take medications as prescribed daily.  Medical Decision Making  Patient presents to [place] seeking detox and residential substance abuse treatment.  In addition he is endorsing passive suicidal ideations with no plan or intent.  Patient is high risk for relapse if discharged and could greatly benefit from admission to the [facility].    Recommendations  Based on my evaluation the patient does not appear to have an emergency medical condition.  Admit patient to the facility base crisis unit.  Disposition: Patient is seeking detox from methamphetamines and cocaine.  He is seeking residential substance abuse treatment.   Lab Orders         SARS Coronavirus 2 by RT PCR (hospital order, performed in [facility] hospital lab) *cepheid single result test* Anterior Nasal Swab         CBC with Differential/Platelet         Comprehensive metabolic panel         Hemoglobin A1c         Magnesium         Ethanol         Lipid panel         TSH         RPR         Urinalysis, Routine w reflex microscopic -Urine, Clean Catch         HIV Antibody (routine testing w rflx)         POCT Urine Drug Screen - (I-Screen)      Revonda Humphrey, NP 01/07/23  5:39 PM

## 2023-01-07 NOTE — Progress Notes (Signed)
   01/07/23 1619  [place] (Walk-ins at [facility] only)  How Did You Hear About [place]? Family/Friend  What Is the Reason for Your Visit/Call Today? Ronnie Herman is a 37 year old male presenting to [facility] voluntarily with chief complaint of meth, cocaine and THC use. Pt reports he has been using meth for the past five years. Pt reports last use of meth was two days ago. Pt smokes an shoots meth. Pt also reports smoking cocaine and THC daily. Pt reports passive SI, no plan, or intent. Pt denies HI, AVH and alcohol use. Pt reports diagnosis of bipolar disorder and he was receiving outpatient treatment at [facility]. Pt has been homeless for the past three years. Pt reports rehab/detox treatment four years ago in [place]. Pt states his motivation for treatment is "I'm tired of using".  How Long Has This Been Causing You Problems? > than 6 months  Have You Recently Had Any Thoughts About Hurting Yourself? Yes  How long ago did you have thoughts about hurting yourself? "here recently"  Are You Planning to [place] At This time? No  Have you Recently Had Thoughts About [place]? No  Are You Planning To Harm Someone At This Time? No  Are you currently experiencing any auditory, visual or other hallucinations? No  Have You Used Any Alcohol or Drugs in the Past 24 Hours? Yes  How long ago did you use Drugs or Alcohol? today  What Did You Use and How Much? cocaine and THC  Do you have any current medical co-morbidities that require immediate attention? No  Clinician description of patient physical appearance/behavior: calm  What Do You Feel Would Help You the Most Today? Treatment for Depression or other mood problem  If access to [facility] Urgent Care was not available, would you have sought care in the Emergency Department? No  Determination of Need Urgent (48 hours)  Options For Referral Medication Management;Outpatient Therapy;Facility-Based Crisis

## 2023-01-07 NOTE — ED Notes (Signed)
Pt sleeping@this time. Breathing even and unlabored. Will continue to monitor for safety 
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
## 2023-01-08 DIAGNOSIS — F319 Bipolar disorder, unspecified: Secondary | ICD-10-CM | POA: Diagnosis not present

## 2023-01-08 DIAGNOSIS — R45851 Suicidal ideations: Secondary | ICD-10-CM | POA: Diagnosis not present

## 2023-01-08 DIAGNOSIS — Z1152 Encounter for screening for COVID-19: Secondary | ICD-10-CM | POA: Diagnosis not present

## 2023-01-08 DIAGNOSIS — F1910 Other psychoactive substance abuse, uncomplicated: Secondary | ICD-10-CM | POA: Diagnosis not present

## 2023-01-08 LAB — GC/CHLAMYDIA PROBE AMP (~~LOC~~) NOT AT ARMC
Chlamydia: NEGATIVE
Comment: NEGATIVE
Comment: NORMAL
Neisseria Gonorrhea: NEGATIVE

## 2023-01-08 LAB — RPR: RPR Ser Ql: NONREACTIVE

## 2023-01-08 LAB — HEMOGLOBIN A1C
Hgb A1c MFr Bld: 4.9 % (ref 4.8–5.6)
Mean Plasma Glucose: 94 mg/dL

## 2023-01-08 MED ORDER — CLONIDINE HCL 0.1 MG PO TABS: 0.1000 mg | ORAL_TABLET | Freq: Every day | ORAL | Status: DC

## 2023-01-08 MED ORDER — DICYCLOMINE HCL 20 MG PO TABS: 20.0000 mg | ORAL_TABLET | Freq: Four times a day (QID) | ORAL | Status: AC | PRN

## 2023-01-08 MED ORDER — NAPROXEN 500 MG PO TABS: 500.0000 mg | ORAL_TABLET | Freq: Two times a day (BID) | ORAL | Status: AC | PRN

## 2023-01-08 MED ORDER — LOPERAMIDE HCL 2 MG PO CAPS: 2.0000 mg | ORAL_CAPSULE | ORAL | Status: AC | PRN

## 2023-01-08 MED ORDER — ONDANSETRON 4 MG PO TBDP: 4.0000 mg | ORAL_TABLET | Freq: Four times a day (QID) | ORAL | Status: AC | PRN

## 2023-01-08 MED ORDER — CLONIDINE HCL 0.1 MG PO TABS
0.1000 mg | ORAL_TABLET | Freq: Four times a day (QID) | ORAL | Status: DC
  Administered 2023-01-09 – 2023-01-10 (×6): 0.1 mg via ORAL
  Filled 2023-01-08 (×6): qty 1

## 2023-01-08 MED ORDER — METHOCARBAMOL 500 MG PO TABS: 500.0000 mg | ORAL_TABLET | Freq: Three times a day (TID) | ORAL | Status: AC | PRN

## 2023-01-08 MED ORDER — CLONIDINE HCL 0.1 MG PO TABS: 0.1000 mg | ORAL_TABLET | ORAL | Status: DC

## 2023-01-08 MED ORDER — HYDROXYZINE HCL 25 MG PO TABS
25.0000 mg | ORAL_TABLET | Freq: Four times a day (QID) | ORAL | Status: AC | PRN
  Administered 2023-01-09 – 2023-01-13 (×4): 25 mg via ORAL
  Filled 2023-01-08 (×4): qty 1

## 2023-01-08 NOTE — ED Notes (Signed)
Patient is sitting quietly in the dining room watching television, no distress noted, will continue to monitor patient for safety

## 2023-01-08 NOTE — ED Notes (Signed)
Pt sleeping in no acute distress. RR even and unlabored. Environment secured. Will continue to monitor for safety. 

## 2023-01-08 NOTE — Group Note (Signed)
Group Topic: Communication  Group Date: 01/08/2023 Start Time: 1500 End Time: 1520 Facilitators: Sherrlyn Hock, RN  Department: [facility]  Number of Participants: 7  Group Focus: self-awareness Treatment Modality:  Patient-Centered Therapy Interventions utilized were group exercise Purpose: express feelings  Name: Ronnie Herman Date of Birth: Mar 13, 1986  MR: [id number]    Level of Participation: active Quality of Participation: cooperative Interactions with others: gave feedback Mood/Affect: appropriate Triggers (if applicable):  Cognition: coherent/clear Progress: Moderate Response:  Plan: patient will be encouraged to continue with therapy  Patients Problems:  Patient Active Problem List   Diagnosis Date Noted   Sepsis due to pneumonia 11/15/2022   Hypokalemia 11/15/2022   Dental infection 11/15/2022   Lactic acidosis 11/15/2022   Pneumonia of both lower lobes due to infectious organism 11/14/2022   Polysubstance abuse 11/14/2022   Bipolar 1 disorder 11/14/2022

## 2023-01-08 NOTE — Tx Team (Signed)
LCSW met with patient to assess current mood, affect, physical state, and inquire about needs/goals while here in [facility] and after discharge. Patient reports he presented due to being tired of using substances. Patient reports he smokes crack, injects meth, and smokes marijuana daily. Patient reports he has been using these substances for the last 4-5 years. Patient reports he has been homeless for the last 3 years. Patient reports prior to coming to [facility], he lived in [place]. Patient reports life being better prior to Covid and his return to [facility]. Patient reports his use began in 2020  when Covid started. Patient reports a 1 year period of sobriety and reports he was working at [employer], and kept himself busy to help maintain the sobriety. Patient reports he was fired from his job because he had pneumonia and was out for an extended period of time. Patient reports having little to no family support. Patient reports all of his family is in [place], [place] and he does not talk to them. Patient reports he has decided to seek treatment at this time because he is tired of using substances. Patient began to cry as he spoke about this. Patient reports having a low and depressed mood since being at the [facility]. Patient reports it is to no fault of anyone else, however reports he has had time to think over his life. Brief counseling was provided to the patient and he was receptive to the feedback provided. Patient reports his goal is to seek residential placement at a 28-30 day program for substance use. Patient aware that LCSW will send referrals out for review and will follow up to provide updates as received. Patient expressed understanding and appreciation of LCSW assistance. No other needs were reported at this time by patient.   Patient has [facility] Medicaid. Referral will be sent to [facility] and [facility] for review. LCSW will continue to follow and provide support to patient while on [facility] unit.   Lucius Conn, LCSW Clinical Social Worker [facility] Ph: [phone]

## 2023-01-08 NOTE — ED Notes (Signed)
Pt is currently sleeping, no distress noted, environmental check complete, will continue to monitor patient for safety.  

## 2023-01-08 NOTE — Group Note (Unsigned)
Group Topic: Social Support  Group Date: 01/08/2023 Start Time: 1000 End Time: [place] Facilitators: Quentin Cornwall B  Department: [facility]  Number of Participants: 7  Group Focus: relapse prevention Treatment Modality:  Psychoeducation Interventions utilized were support Purpose: relapse prevention strategies   Name: Ronnie Herman Date of Birth: Dec 27, 1985  MR: [id number]    Level of Participation: {THERAPIES; PSYCH GROUP PARTICIPATION [id number] Quality of Participation: {THERAPIES; PSYCH QUALITY OF PARTICIPATION:23992} Interactions with others: {THERAPIES; PSYCH INTERACTIONS:23993} Mood/Affect: {THERAPIES; PSYCH MOOD/AFFECT:23994} Triggers (if applicable): *** Cognition: {THERAPIES; PSYCH COGNITION:23995} Progress: {THERAPIES; PSYCH PROGRESS:23997} Response: *** Plan: {THERAPIES; PSYCH [id number]  Patients Problems:  Patient Active Problem List   Diagnosis Date Noted   Sepsis due to pneumonia 11/15/2022   Hypokalemia 11/15/2022   Dental infection 11/15/2022   Lactic acidosis 11/15/2022   Pneumonia of both lower lobes due to infectious organism 11/14/2022   Polysubstance abuse 11/14/2022   Bipolar 1 disorder 11/14/2022

## 2023-01-08 NOTE — ED Notes (Signed)
Patient A&Ox4. Denies intent to harm self/others when asked. Denies A/VH. Patient denies any physical complaints when asked. No acute distress noted. Routine safety checks conducted according to facility protocol. Encouraged patient to notify staff if thoughts of harm toward self or others arise. Patient verbalize understanding and agreement. Will continue to monitor for safety.    

## 2023-01-08 NOTE — ED Notes (Signed)
Pt sitting in dayroom interacting with peers. No acute distress noted. No concerns voiced. Informed pt to notify staff with any needs or assistance. Pt verbalized understanding or agreement. Will continue to monitor for safety. 

## 2023-01-08 NOTE — Group Note (Signed)
Group Topic: Social Support  Group Date: 01/08/2023 Start Time: 1015 End Time: 1045 Facilitators: Quentin Cornwall B  Department: [facility]  Number of Participants: 7  Group Focus: relapse prevention and social skills Treatment Modality:  Psychoeducation Interventions utilized were support Purpose: relapse prevention strategies  Name: Ronnie Herman Date of Birth: [birth year]  MR: [id number]    Level of Participation: moderate Quality of Participation: attentive and cooperative Interactions with others: gave feedback Mood/Affect: positive Triggers (if applicable): na Cognition: coherent/clear Progress: Moderate Response: na Plan: follow-up needed  Patients Problems:  Patient Active Problem List   Diagnosis Date Noted   Sepsis due to pneumonia 11/15/2022   Hypokalemia 11/15/2022   Dental infection 11/15/2022   Lactic acidosis 11/15/2022   Pneumonia of both lower lobes due to infectious organism 11/14/2022   Polysubstance abuse 11/14/2022   Bipolar 1 disorder 11/14/2022

## 2023-01-08 NOTE — ED Provider Notes (Signed)
Behavioral Health Progress Note  Date and Time: 01/08/2023 11:29 PM Name: Ronnie Herman MRN:  [id number]  Subjective:  Ronnie Herman is a 37 year old male with a psychiatric history of bipolar 1 disorder and polysubstance use (crack cocaine, meth, and marijuana) who was admitted to [facility] 4/1 for detox and assistance with substance use treatment.   On assessment today, patient reiterates that he would like help with residential substance use treatment. He denies withdrawal symptoms at this time but states that now that he has had a moment to reflect, he feels a bit down today. He denies SI, HI, and AVH. He denies having a support system in the area, as family primarily resides in [place]. Patient becomes tearful when discussing a recent stressor of losing his job, and therefore livelihood, at [employer]. He has been homeless for the past 6 months and has found it increasingly difficult to simply live.   Diagnosis:  Final diagnoses:  Polysubstance abuse    Total Time spent with patient: 30 minutes  Past Psychiatric History: Patient has a several forted psychiatric history of bipolar 1 disorder and polysubstance abuse (methamphetamines, cocaine, and marijuana).  Past Medical History: See chart Family History: None reported Social History: "using meth, cocaine, and marijuana daily for the past 4 years. He has participated in residential substance abuse treatment in the past roughly 3-4 years ago. He is unsure of the amount that he is using but he is snorting cocaine and smoking daily. He is injecting methamphetamines daily."  Additional Social History:    Pain Medications: SEE MAR Prescriptions: SEE MAR Over the Counter: SEE MAR History of alcohol / drug use?: Yes Longest period of sobriety (when/how long): UNKNOWN Negative Consequences of Use: Personal relationships, Museum/gallery curator, Work / School Withdrawal Symptoms: None Name of Substance 1: METH 1 - Age of First Use: 31 1 -  Amount (size/oz): 1/2 GRAM 1 - Frequency: DAILY 1 - Duration: ONGOING 1 - Last Use / Amount: TWO DAYS AGO 1 - Method of Aquiring: UNKNOWN 1- Route of Use: SMOKE AND IV Name of Substance 2: COCAINE 2 - Age of First Use: 31 2 - Amount (size/oz): UNKNOWN 2 - Frequency: DAILY 2 - Duration: 2 WEEKS 2 - Last Use / Amount: TODAY 2 - Method of Aquiring: UNKNOWN 2 - Route of Substance Use: SMOKING Name of Substance 3: THC 3 - Age of First Use: 14 3 - Amount (size/oz): 2 BLUNTS 3 - Frequency: DAILY 3 - Duration: ONGOING 3 - Last Use / Amount: TODAY 3 - Method of Aquiring: UNKNOWN 3 - Route of Substance Use: SMOKING              Sleep: Good  Appetite:  Good  Current Medications:  Current Facility-Administered Medications  Medication Dose Route Frequency Provider Last Rate Last Admin   acetaminophen (TYLENOL) tablet 650 mg  650 mg Oral Q6H PRN Revonda Humphrey, NP       alum & mag hydroxide-simeth (MAALOX/MYLANTA) 200-200-20 MG/5ML suspension 30 mL  30 mL Oral Q4H PRN Revonda Humphrey, NP       hydrOXYzine (ATARAX) tablet 25 mg  25 mg Oral TID PRN Revonda Humphrey, NP       magnesium hydroxide (MILK OF MAGNESIA) suspension 30 mL  30 mL Oral Daily PRN Revonda Humphrey, NP       traZODone (DESYREL) tablet 50 mg  50 mg Oral QHS PRN Revonda Humphrey, NP   50 mg at 01/08/23 2002  No current outpatient medications on file.    Labs  Lab Results:  Admission on 01/07/2023  Component Date Value Ref Range Status   WBC 01/07/2023 6.2  4.0 - 10.5 K/uL Final   RBC 01/07/2023 4.48  4.22 - 5.81 MIL/uL Final   Hemoglobin 01/07/2023 13.5  13.0 - 17.0 g/dL Final   HCT 01/07/2023 43.4  39.0 - 52.0 % Final   MCV 01/07/2023 96.9  80.0 - 100.0 fL Final   MCH 01/07/2023 30.1  26.0 - 34.0 pg Final   MCHC 01/07/2023 31.1  30.0 - 36.0 g/dL Final   RDW 01/07/2023 13.2  11.5 - 15.5 % Final   Platelets 01/07/2023 289  150 - 400 K/uL Final   nRBC 01/07/2023 0.0  0.0 - 0.2 % Final    Neutrophils Relative % 01/07/2023 65  % Final   Neutro Abs 01/07/2023 4.1  1.7 - 7.7 K/uL Final   Lymphocytes Relative 01/07/2023 25  % Final   Lymphs Abs 01/07/2023 1.5  0.7 - 4.0 K/uL Final   Monocytes Relative 01/07/2023 7  % Final   Monocytes Absolute 01/07/2023 0.4  0.1 - 1.0 K/uL Final   Eosinophils Relative 01/07/2023 2  % Final   Eosinophils Absolute 01/07/2023 0.1  0.0 - 0.5 K/uL Final   Basophils Relative 01/07/2023 1  % Final   Basophils Absolute 01/07/2023 0.0  0.0 - 0.1 K/uL Final   Immature Granulocytes 01/07/2023 0  % Final   Abs Immature Granulocytes 01/07/2023 0.01  0.00 - 0.07 K/uL Final   Performed at [facility], [place] [address], [place], [place] [address]   Sodium 01/07/2023 138  135 - 145 mmol/L Final   Potassium 01/07/2023 4.1  3.5 - 5.1 mmol/L Final   Chloride 01/07/2023 100  98 - 111 mmol/L Final   CO2 01/07/2023 28  22 - 32 mmol/L Final   Glucose, Bld 01/07/2023 121 (H)  70 - 99 mg/dL Final   Glucose reference range applies only to samples taken after fasting for at least 8 hours.   BUN 01/07/2023 12  6 - 20 mg/dL Final   Creatinine, Ser 01/07/2023 0.86  0.61 - 1.24 mg/dL Final   Calcium 01/07/2023 9.3  8.9 - 10.3 mg/dL Final   Total Protein 01/07/2023 6.3 (L)  6.5 - 8.1 g/dL Final   Albumin 01/07/2023 3.8  3.5 - 5.0 g/dL Final   AST 01/07/2023 19  15 - 41 U/L Final   ALT 01/07/2023 17  0 - 44 U/L Final   Alkaline Phosphatase 01/07/2023 76  38 - 126 U/L Final   Total Bilirubin 01/07/2023 0.6  0.3 - 1.2 mg/dL Final   GFR, Estimated 01/07/2023 >60  >60 mL/min Final   Comment: (NOTE) Calculated using the CKD-EPI Creatinine Equation (2021)    Anion gap 01/07/2023 10  5 - 15 Final   Performed at [facility], [place] [address], [place], [place] [address]   Hgb A1c MFr Bld 01/07/2023 4.9  4.8 - 5.6 % Final   Comment: (NOTE)         Prediabetes: 5.7 - 6.4         Diabetes: >6.4         Glycemic control for adults with diabetes: <7.0    Mean Plasma  Glucose 01/07/2023 94  mg/dL Final   Comment: (NOTE) Performed At: [facility] [place], [place] [id number] Rush Farmer MD [id number]    Magnesium 01/07/2023 2.2  1.7 - 2.4  mg/dL Final   Performed at [facility], [place] [address], [place], [place] [address]   Alcohol, Ethyl (B) 01/07/2023 <10  <10 mg/dL Final   Comment: (NOTE) Lowest detectable limit for serum alcohol is 10 mg/dL.  For medical purposes only. Performed at [facility], [place] [address], [place], [place] [address]    Cholesterol 01/07/2023 184  0 - 200 mg/dL Final   Triglycerides 01/07/2023 151 (H)  <150 mg/dL Final   HDL 01/07/2023 56  >40 mg/dL Final   Total CHOL/HDL Ratio 01/07/2023 3.3  RATIO Final   VLDL 01/07/2023 30  0 - 40 mg/dL Final   LDL Cholesterol 01/07/2023 98  0 - 99 mg/dL Final   Comment:        Total Cholesterol/HDL:CHD Risk Coronary Heart Disease Risk Table                     Men   Women  1/2 Average Risk   3.4   3.3  Average Risk       5.0   4.4  2 X Average Risk   9.6   7.1  3 X Average Risk  23.4   11.0        Use the calculated Patient Ratio above and the CHD Risk Table to determine the patient's CHD Risk.        ATP III CLASSIFICATION (LDL):  <100     mg/dL   Optimal  100-129  mg/dL   Near or Above                    Optimal  130-159  mg/dL   Borderline  160-189  mg/dL   High  >190     mg/dL   Very High Performed at [place] [address], [place], [place] [address]    TSH 01/07/2023 0.336 (L)  0.350 - 4.500 uIU/mL Final   Comment: Performed by a 3rd Generation assay with a functional sensitivity of <=0.01 uIU/mL. Performed at [facility], [place] [address], [place], [place] [address]    Chlamydia 01/07/2023 Negative   Final   Neisseria Gonorrhea 01/07/2023 Negative   Final   Comment 01/07/2023 Normal Reference [place] Chlamydia - Negative   Final   Comment 01/07/2023 Normal Reference Range Neisseria Gonorrhea - Negative    Final   RPR Ser Ql 01/07/2023 NON REACTIVE  NON REACTIVE Final   Performed at [facility], [place] [address] [place], [place] [address]   POC Amphetamine UR 01/07/2023 None Detected  NONE DETECTED (Cut Off Level 1000 ng/mL) Final   POC Secobarbital (BAR) 01/07/2023 None Detected  NONE DETECTED (Cut Off Level 300 ng/mL) Final   POC Buprenorphine (BUP) 01/07/2023 None Detected  NONE DETECTED (Cut Off Level 10 ng/mL) Final   POC Oxazepam (BZO) 01/07/2023 None Detected  NONE DETECTED (Cut Off Level 300 ng/mL) Final   POC Cocaine UR 01/07/2023 Positive (A)  NONE DETECTED (Cut Off Level 300 ng/mL) Final   POC Methamphetamine UR 01/07/2023 Positive (A)  NONE DETECTED (Cut Off Level 1000 ng/mL) Final   POC Morphine 01/07/2023 None Detected  NONE DETECTED (Cut Off Level 300 ng/mL) Final   POC Methadone UR 01/07/2023 None Detected  NONE DETECTED (Cut Off Level 300 ng/mL) Final   POC Oxycodone UR 01/07/2023 None Detected  NONE DETECTED (Cut Off Level 100 ng/mL) Final   POC Marijuana UR 01/07/2023 Positive (A)  NONE DETECTED (Cut  Off Level 50 ng/mL) Final   Color, Urine 01/07/2023 YELLOW  YELLOW Final   APPearance 01/07/2023 CLEAR  CLEAR Final   Specific Gravity, Urine 01/07/2023 1.026  1.005 - 1.030 Final   pH 01/07/2023 5.0  5.0 - 8.0 Final   Glucose, UA 01/07/2023 NEGATIVE  NEGATIVE mg/dL Final   Hgb urine dipstick 01/07/2023 NEGATIVE  NEGATIVE Final   Bilirubin Urine 01/07/2023 NEGATIVE  NEGATIVE Final   Ketones, ur 01/07/2023 5 (A)  NEGATIVE mg/dL Final   Protein, ur 01/07/2023 NEGATIVE  NEGATIVE mg/dL Final   Nitrite 01/07/2023 NEGATIVE  NEGATIVE Final   Leukocytes,Ua 01/07/2023 NEGATIVE  NEGATIVE Final   Performed at [facility], [place] [address], [place], [place] [address]   HIV Screen 4th Generation wRfx 01/07/2023 Non Reactive  Non Reactive Final   Performed at [facility], [place] [address] [place], [place] [address]   SARSCOV2ONAVIRUS 2 AG 01/07/2023 NEGATIVE  NEGATIVE Final    Comment: (NOTE) SARS-CoV-2 antigen NOT DETECTED.   Negative results are presumptive.  Negative results do not preclude SARS-CoV-2 infection and should not be used as the sole basis for treatment or other patient management decisions, including infection  control decisions, particularly in the presence of clinical signs and  symptoms consistent with COVID-19, or in those who have been in contact with the virus.  Negative results must be combined with clinical observations, patient history, and epidemiological information. The expected result is Negative.  Fact Sheet for Patients: HandmadeRecipes.com.cy  Fact Sheet for Healthcare Providers: FuneralLife.at  This test is not yet approved or cleared by the [place] FDA and  has been authorized for detection and/or diagnosis of SARS-CoV-2 by FDA under an Emergency Use Authorization (EUA).  This EUA will remain in effect (meaning this test can be used) for the duration of  the COV                          ID-19 declaration under Section 564(b)(1) of the Act, 21 U.S.C. section 360bbb-3(b)(1), unless the authorization is terminated or revoked sooner.     SARS Coronavirus 2 by RT PCR 01/07/2023 NEGATIVE  NEGATIVE Final   Performed at [facility], [place] [address] [place], [place] [address]  Admission on 11/14/2022, Discharged on 11/16/2022  Component Date Value Ref Range Status   Sodium 11/14/2022 134 (L)  135 - 145 mmol/L Final   Potassium 11/14/2022 3.5  3.5 - 5.1 mmol/L Final   Chloride 11/14/2022 95 (L)  98 - 111 mmol/L Final   CO2 11/14/2022 27  22 - 32 mmol/L Final   Glucose, Bld 11/14/2022 104 (H)  70 - 99 mg/dL Final   Glucose reference range applies only to samples taken after fasting for at least 8 hours.   BUN 11/14/2022 15  6 - 20 mg/dL Final   Creatinine, Ser 11/14/2022 1.19  0.61 - 1.24 mg/dL Final   Calcium 11/14/2022 9.0  8.9 - 10.3 mg/dL Final   GFR, Estimated  11/14/2022 >60  >60 mL/min Final   Comment: (NOTE) Calculated using the CKD-EPI Creatinine Equation (2021)    Anion gap 11/14/2022 12  5 - 15 Final   Performed at [facility], [place] [address], [place], [place] [address]   WBC 11/14/2022 25.4 (H)  4.0 - 10.5 K/uL Final   RBC 11/14/2022 4.30  4.22 - 5.81 MIL/uL Final   Hemoglobin 11/14/2022 13.4  13.0 - 17.0 g/dL Final  HCT 11/14/2022 41.8  39.0 - 52.0 % Final   MCV 11/14/2022 97.2  80.0 - 100.0 fL Final   MCH 11/14/2022 31.2  26.0 - 34.0 pg Final   MCHC 11/14/2022 32.1  30.0 - 36.0 g/dL Final   RDW 11/14/2022 13.3  11.5 - 15.5 % Final   Platelets 11/14/2022 325  150 - 400 K/uL Final   nRBC 11/14/2022 0.0  0.0 - 0.2 % Final   Performed at [facility], [place] [address], [place], [place] [address]   Troponin I (High Sensitivity) 11/14/2022 2  <18 ng/L Final   Comment: (NOTE) Elevated high sensitivity troponin I (hsTnI) values and significant  changes across serial measurements may suggest ACS but many other  chronic and acute conditions are known to elevate hsTnI results.  Refer to the "Links" section for chest pain algorithms and additional  guidance. Performed at [facility], [place] [address], [place], [place] [address]    SARS Coronavirus 2 by RT PCR 11/14/2022 NEGATIVE  NEGATIVE Final   Comment: (NOTE) SARS-CoV-2 target nucleic acids are NOT DETECTED.  The SARS-CoV-2 RNA is generally detectable in upper respiratory specimens during the acute phase of infection. The lowest concentration of SARS-CoV-2 viral copies this assay can detect is 138 copies/mL. A negative result does not preclude SARS-Cov-2 infection and should not be used as the sole basis for treatment or other patient management decisions. A negative result may occur with  improper specimen collection/handling, submission of specimen other than nasopharyngeal swab, presence of viral mutation(s) within the areas  targeted by this assay, and inadequate number of viral copies(<138 copies/mL). A negative result must be combined with clinical observations, patient history, and epidemiological information. The expected result is Negative.  Fact Sheet for Patients:  EntrepreneurPulse.com.au  Fact Sheet for Healthcare Providers:  IncredibleEmployment.be  This test is no                          t yet approved or cleared by the [place] FDA and  has been authorized for detection and/or diagnosis of SARS-CoV-2 by FDA under an Emergency Use Authorization (EUA). This EUA will remain  in effect (meaning this test can be used) for the duration of the COVID-19 declaration under Section 564(b)(1) of the Act, 21 U.S.C.section 360bbb-3(b)(1), unless the authorization is terminated  or revoked sooner.       Influenza A by PCR 11/14/2022 NEGATIVE  NEGATIVE Final   Influenza B by PCR 11/14/2022 NEGATIVE  NEGATIVE Final   Comment: (NOTE) The Xpert Xpress SARS-CoV-2/FLU/RSV plus assay is intended as an aid in the diagnosis of influenza from Nasopharyngeal swab specimens and should not be used as a sole basis for treatment. Nasal washings and aspirates are unacceptable for Xpert Xpress SARS-CoV-2/FLU/RSV testing.  Fact Sheet for Patients: EntrepreneurPulse.com.au  Fact Sheet for Healthcare Providers: IncredibleEmployment.be  This test is not yet approved or cleared by the [place] FDA and has been authorized for detection and/or diagnosis of SARS-CoV-2 by FDA under an Emergency Use Authorization (EUA). This EUA will remain in effect (meaning this test can be used) for the duration of the COVID-19 declaration under Section 564(b)(1) of the Act, 21 U.S.C. section 360bbb-3(b)(1), unless the authorization is terminated or revoked.     Resp Syncytial Virus by PCR 11/14/2022 NEGATIVE  NEGATIVE Final   Comment: (NOTE) Fact Sheet  for Patients: EntrepreneurPulse.com.au  Fact Sheet for Healthcare Providers: IncredibleEmployment.be  This test is not  yet approved or cleared by the [place] and has been authorized for detection and/or diagnosis of SARS-CoV-2 by FDA under an Emergency Use Authorization (EUA). This EUA will remain in effect (meaning this test can be used) for the duration of the COVID-19 declaration under Section 564(b)(1) of the Act, 21 U.S.C. section 360bbb-3(b)(1), unless the authorization is terminated or revoked.  Performed at [facility], [place] [address], [place], [place] [address]    D-Dimer, Quant 11/14/2022 1.78 (H)  0.00 - 0.50 ug/mL-FEU Final   Comment: (NOTE) At the manufacturer cut-off value of 0.5 g/mL FEU, this assay has a negative predictive value of 95-100%.This assay is intended for use in conjunction with a clinical pretest probability (PTP) assessment model to exclude pulmonary embolism (PE) and deep venous thrombosis (DVT) in outpatients suspected of PE or DVT. Results should be correlated with clinical presentation. Performed at [facility], [place] [address] [place], [place] [address]    Opiates 11/14/2022 NONE DETECTED  NONE DETECTED Final   Cocaine 11/14/2022 NONE DETECTED  NONE DETECTED Final   Benzodiazepines 11/14/2022 NONE DETECTED  NONE DETECTED Final   Amphetamines 11/14/2022 POSITIVE (A)  NONE DETECTED Final   Tetrahydrocannabinol 11/14/2022 POSITIVE (A)  NONE DETECTED Final   Barbiturates 11/14/2022 NONE DETECTED  NONE DETECTED Final   Comment: (NOTE) DRUG SCREEN FOR MEDICAL PURPOSES ONLY.  IF CONFIRMATION IS NEEDED FOR ANY PURPOSE, NOTIFY LAB WITHIN 5 DAYS.  LOWEST DETECTABLE LIMITS FOR URINE DRUG SCREEN Drug Class                     Cutoff (ng/mL) Amphetamine and metabolites    1000 Barbiturate and metabolites    200 Benzodiazepine                 200 Opiates and  metabolites        300 Cocaine and metabolites        300 THC                            50 Performed at [facility], [place] [address], [place], [place] [address]    Troponin I (High Sensitivity) 11/14/2022 3  <18 ng/L Final   Comment: (NOTE) Elevated high sensitivity troponin I (hsTnI) values and significant  changes across serial measurements may suggest ACS but many other  chronic and acute conditions are known to elevate hsTnI results.  Refer to the "Links" section for chest pain algorithms and additional  guidance. Performed at [facility], [place] [address], [place], [place] [address]    HIV Screen 4th Generation wRfx 11/14/2022 Non Reactive  Non Reactive Final   Performed at [facility], [place] [address], [place], [place] [address]   Strep Pneumo Urinary Antigen 11/14/2022 NEGATIVE  NEGATIVE Final   Comment:        Infection due to S. pneumoniae cannot be absolutely ruled out since the antigen present may be below the detection limit of the test. Performed at [facility], [address], [place], [place] [address]    L. pneumophila Serogp 1 Ur Ag 11/14/2022 Negative  Negative Final   Comment: (NOTE) Presumptive negative for L. pneumophila serogroup 1 antigen in urine, suggesting no recent or current infection. Legionnaires' disease cannot be ruled out since other serogroups and species may also cause disease. Performed At: [facility] [address] [place], [place] [id number]  Rush Farmer MD [id number]    Source of Sample 11/14/2022 URINE, CLEAN CATCH   Final   Performed at [facility], [place] [address] [place], [place] [address]   Sodium 11/15/2022 133 (L)  135 - 145 mmol/L Final   Potassium 11/15/2022 3.3 (L)  3.5 - 5.1 mmol/L Final   Chloride 11/15/2022 99  98 - 111 mmol/L Final   CO2 11/15/2022 22  22 - 32 mmol/L Final   Glucose, Bld 11/15/2022 116 (H)  70 - 99 mg/dL Final   Glucose  reference range applies only to samples taken after fasting for at least 8 hours.   BUN 11/15/2022 10  6 - 20 mg/dL Final   Creatinine, Ser 11/15/2022 0.73  0.61 - 1.24 mg/dL Final   Calcium 11/15/2022 7.9 (L)  8.9 - 10.3 mg/dL Final   Total Protein 11/15/2022 5.9 (L)  6.5 - 8.1 g/dL Final   Albumin 11/15/2022 2.9 (L)  3.5 - 5.0 g/dL Final   AST 11/15/2022 22  15 - 41 U/L Final   ALT 11/15/2022 22  0 - 44 U/L Final   Alkaline Phosphatase 11/15/2022 68  38 - 126 U/L Final   Total Bilirubin 11/15/2022 1.7 (H)  0.3 - 1.2 mg/dL Final   GFR, Estimated 11/15/2022 >60  >60 mL/min Final   Comment: (NOTE) Calculated using the CKD-EPI Creatinine Equation (2021)    Anion gap 11/15/2022 12  5 - 15 Final   Performed at [facility], [place] [address], [place], [place] [address]   WBC 11/15/2022 25.4 (H)  4.0 - 10.5 K/uL Final   RBC 11/15/2022 3.27 (L)  4.22 - 5.81 MIL/uL Final   Hemoglobin 11/15/2022 10.2 (L)  13.0 - 17.0 g/dL Final   HCT 11/15/2022 32.0 (L)  39.0 - 52.0 % Final   MCV 11/15/2022 97.9  80.0 - 100.0 fL Final   MCH 11/15/2022 31.2  26.0 - 34.0 pg Final   MCHC 11/15/2022 31.9  30.0 - 36.0 g/dL Final   RDW 11/15/2022 13.5  11.5 - 15.5 % Final   Platelets 11/15/2022 245  150 - 400 K/uL Final   nRBC 11/15/2022 0.0  0.0 - 0.2 % Final   Neutrophils Relative % 11/15/2022 87  % Final   Neutro Abs 11/15/2022 21.9 (H)  1.7 - 7.7 K/uL Final   Lymphocytes Relative 11/15/2022 4  % Final   Lymphs Abs 11/15/2022 1.0  0.7 - 4.0 K/uL Final   Monocytes Relative 11/15/2022 6  % Final   Monocytes Absolute 11/15/2022 1.6 (H)  0.1 - 1.0 K/uL Final   Eosinophils Relative 11/15/2022 0  % Final   Eosinophils Absolute 11/15/2022 0.0  0.0 - 0.5 K/uL Final   Basophils Relative 11/15/2022 0  % Final   Basophils Absolute 11/15/2022 0.1  0.0 - 0.1 K/uL Final   Immature Granulocytes 11/15/2022 3  % Final   Abs Immature Granulocytes 11/15/2022 0.75 (H)  0.00 - 0.07 K/uL Final   Performed at  [facility], [place] [address], [place], [place] [address]   Phosphorus 11/15/2022 3.3  2.5 - 4.6 mg/dL Final   Performed at [facility], [place] [address] [place], [place] [address]   Magnesium 11/15/2022 1.8  1.7 - 2.4 mg/dL Final   Performed at [place] [address], [place], [place] [address]   Specimen Description 11/14/2022    Final  Value:BLOOD RIGHT ANTECUBITAL Performed at [place] [address] [place], [place] [address]    Special Requests 11/14/2022    Final                   Value:BOTTLES DRAWN AEROBIC AND ANAEROBIC Blood Culture adequate volume Performed at [place] [address], [place], [place] [address]    Culture 11/14/2022    Final                   Value:NO GROWTH 5 DAYS Performed at [place] [address], [place], [place] [address]    Report Status 11/14/2022 11/19/2022 FINAL   Final   Specimen Description 11/14/2022    Final                   Value:BLOOD BLOOD RIGHT HAND Performed at [facility], [place] [address] [place], [place] [address]    Special Requests 11/14/2022    Final                   Value:BOTTLES DRAWN AEROBIC AND ANAEROBIC Blood Culture adequate volume Performed at [place] [address], [place], [place] [address]    Culture 11/14/2022    Final                   Value:NO GROWTH 5 DAYS Performed at [place] [address], [place], [place] [address]    Report Status 11/14/2022 11/20/2022 FINAL   Final   Lactic Acid, Venous 11/15/2022 1.1  0.5 - 1.9 mmol/L Final   Performed at [place] [address] [place], [place] [address]   Lactic Acid, Venous 11/15/2022 3.0 (HH)  0.5 - 1.9 mmol/L Final   Comment: CRITICAL RESULT CALLED TO, READ BACK BY AND VERIFIED WITH DARK,A. RN AT 1132 11/15/22 MULLINS,T Performed at [facility], [place] [address] [place], [place] [address]    MRSA by PCR Next Gen 11/15/2022 NOT DETECTED  NOT DETECTED Final   Comment: (NOTE) The GeneXpert MRSA Assay (FDA approved for NASAL specimens only), is one component of a comprehensive MRSA colonization surveillance program. It is not intended to diagnose MRSA infection nor to guide or monitor treatment for MRSA infections. Test performance is not FDA approved in patients less than 58 years old. Performed at [facility], [place] [address] [place], [place] [address]    Lactic Acid, Venous 11/15/2022 1.3  0.5 - 1.9 mmol/L Final   Performed at [place] [address] [place], [place] [address]   WBC 11/16/2022 13.9 (H)  4.0 - 10.5 K/uL Final   RBC 11/16/2022 3.12 (L)  4.22 - 5.81 MIL/uL Final   Hemoglobin 11/16/2022 9.7 (L)  13.0 - 17.0 g/dL Final   HCT 11/16/2022 30.5 (L)  39.0 - 52.0 % Final   MCV 11/16/2022 97.8  80.0 - 100.0 fL Final   MCH 11/16/2022 31.1  26.0 - 34.0 pg Final   MCHC 11/16/2022 31.8  30.0 - 36.0 g/dL Final   RDW 11/16/2022 13.5  11.5 - 15.5 % Final   Platelets 11/16/2022 243  150 - 400 K/uL Final   nRBC 11/16/2022 0.0  0.0 - 0.2 % Final   Neutrophils Relative % 11/16/2022 76  % Final   Neutro Abs 11/16/2022 10.6 (H)  1.7 - 7.7 K/uL Final   Lymphocytes Relative 11/16/2022 12  %  Final   Lymphs Abs 11/16/2022 1.6  0.7 - 4.0 K/uL Final   Monocytes Relative 11/16/2022 10  % Final   Monocytes Absolute 11/16/2022 1.4 (H)  0.1 - 1.0 K/uL Final   Eosinophils Relative 11/16/2022 1  % Final   Eosinophils Absolute 11/16/2022 0.1  0.0 - 0.5 K/uL Final   Basophils Relative 11/16/2022 0  % Final   Basophils Absolute 11/16/2022 0.0  0.0 - 0.1 K/uL Final   Immature Granulocytes 11/16/2022 1  % Final   Abs Immature Granulocytes 11/16/2022 0.11 (H)  0.00 - 0.07 K/uL Final   Performed at [facility], [place] [address] [place], [place] [address]   Sodium  11/16/2022 135  135 - 145 mmol/L Final   Potassium 11/16/2022 3.8  3.5 - 5.1 mmol/L Final   Chloride 11/16/2022 103  98 - 111 mmol/L Final   CO2 11/16/2022 23  22 - 32 mmol/L Final   Glucose, Bld 11/16/2022 88  70 - 99 mg/dL Final   Glucose reference range applies only to samples taken after fasting for at least 8 hours.   BUN 11/16/2022 8  6 - 20 mg/dL Final   Creatinine, Ser 11/16/2022 0.75  0.61 - 1.24 mg/dL Final   Calcium 11/16/2022 7.8 (L)  8.9 - 10.3 mg/dL Final   Total Protein 11/16/2022 6.0 (L)  6.5 - 8.1 g/dL Final   Albumin 11/16/2022 2.5 (L)  3.5 - 5.0 g/dL Final   AST 11/16/2022 13 (L)  15 - 41 U/L Final   ALT 11/16/2022 15  0 - 44 U/L Final   Alkaline Phosphatase 11/16/2022 57  38 - 126 U/L Final   Total Bilirubin 11/16/2022 0.7  0.3 - 1.2 mg/dL Final   GFR, Estimated 11/16/2022 >60  >60 mL/min Final   Comment: (NOTE) Calculated using the CKD-EPI Creatinine Equation (2021)    Anion gap 11/16/2022 9  5 - 15 Final   Performed at [facility], [place] [address], [place], [place] [address]   Magnesium 11/16/2022 2.0  1.7 - 2.4 mg/dL Final   Performed at [facility] [address] [place], [place] [address]   Procalcitonin 11/16/2022 4.93  ng/mL Final   Comment:        Interpretation: PCT > 2 ng/mL: Systemic infection (sepsis) is likely, unless other causes are known. (NOTE)       Sepsis PCT Algorithm           Lower Respiratory Tract                                      Infection PCT Algorithm    ----------------------------     ----------------------------         PCT < 0.25 ng/mL                PCT < 0.10 ng/mL          Strongly encourage             Strongly discourage   discontinuation of antibiotics    initiation of antibiotics    ----------------------------     -----------------------------       PCT 0.25 - 0.50 ng/mL            PCT 0.10 - 0.25 ng/mL               OR       >80%  decrease in PCT            Discourage  initiation of                                            antibiotics      Encourage discontinuation           of antibiotics    ----------------------------     -----------------------------         PCT >= 0.50 ng/mL              PCT 0.26 - 0.50 ng/mL               AND       <80% decrease in PCT                                       Encourage initiation of                                             antibiotics       Encourage continuation           of antibiotics    ----------------------------     -----------------------------        PCT >= 0.50 ng/mL                  PCT > 0.50 ng/mL               AND         increase in PCT                  Strongly encourage                                      initiation of antibiotics    Strongly encourage escalation           of antibiotics                                     -----------------------------                                           PCT <= 0.25 ng/mL                                                 OR                                        > 80% decrease in PCT  Discontinue / Do not initiate                                             antibiotics  Performed at [facility] [address], [place],  [address]   Admission on 10/29/2022, Discharged on 10/30/2022  Component Date Value Ref Range Status   SARS Coronavirus 2 by RT PCR 10/29/2022 NEGATIVE  NEGATIVE Final   Comment: (NOTE) SARS-CoV-2 target nucleic acids are NOT DETECTED.  The SARS-CoV-2 RNA is generally detectable in upper respiratory specimens during the acute phase of infection. The lowest concentration of SARS-CoV-2 viral copies this assay can detect is 138 copies/mL. A negative result does not preclude SARS-Cov-2 infection and should not be used as the sole basis for treatment or other patient management decisions. A negative result may occur with  improper specimen collection/handling,  submission of specimen other than nasopharyngeal swab, presence of viral mutation(s) within the areas targeted by this assay, and inadequate number of viral copies(<138 copies/mL). A negative result must be combined with clinical observations, patient history, and epidemiological information. The expected result is Negative.  Fact Sheet for Patients:  EntrepreneurPulse.com.au  Fact Sheet for Healthcare Providers:  IncredibleEmployment.be  This test is no                          t yet approved or cleared by the [place] FDA and  has been authorized for detection and/or diagnosis of SARS-CoV-2 by FDA under an Emergency Use Authorization (EUA). This EUA will remain  in effect (meaning this test can be used) for the duration of the COVID-19 declaration under Section 564(b)(1) of the Act, 21 U.S.C.section 360bbb-3(b)(1), unless the authorization is terminated  or revoked sooner.       Influenza A by PCR 10/29/2022 NEGATIVE  NEGATIVE Final   Influenza B by PCR 10/29/2022 NEGATIVE  NEGATIVE Final   Comment: (NOTE) The Xpert Xpress SARS-CoV-2/FLU/RSV plus assay is intended as an aid in the diagnosis of influenza from Nasopharyngeal swab specimens and should not be used as a sole basis for treatment. Nasal washings and aspirates are unacceptable for Xpert Xpress SARS-CoV-2/FLU/RSV testing.  Fact Sheet for Patients: EntrepreneurPulse.com.au  Fact Sheet for Healthcare Providers: IncredibleEmployment.be  This test is not yet approved or cleared by the [place] FDA and has been authorized for detection and/or diagnosis of SARS-CoV-2 by FDA under an Emergency Use Authorization (EUA). This EUA will remain in effect (meaning this test can be used) for the duration of the COVID-19 declaration under Section 564(b)(1) of the Act, 21 U.S.C. section 360bbb-3(b)(1), unless the authorization is terminated  or revoked.     Resp Syncytial Virus by PCR 10/29/2022 NEGATIVE  NEGATIVE Final   Comment: (NOTE) Fact Sheet for Patients: EntrepreneurPulse.com.au  Fact Sheet for Healthcare Providers: IncredibleEmployment.be  This test is not yet approved or cleared by the [place] FDA and has been authorized for detection and/or diagnosis of SARS-CoV-2 by FDA under an Emergency Use Authorization (EUA). This EUA will remain in effect (meaning this test can be used) for the duration of the COVID-19 declaration under Section 564(b)(1) of the Act, 21 U.S.C. section 360bbb-3(b)(1), unless the authorization is terminated or revoked.  Performed at [facility], [place] [address], [place], [place] [address]    Group A Strep by PCR 10/29/2022  NOT DETECTED  NOT DETECTED Final   Performed at [facility], [place] [address] [place], [place] [address]    Blood Alcohol level:  Lab Results  Component Value Date   ETH <10 [id number]    Metabolic Disorder Labs: Lab Results  Component Value Date   HGBA1C 4.9 01/07/2023   MPG 94 01/07/2023   No results found for: "PROLACTIN" Lab Results  Component Value Date   CHOL 184 01/07/2023   TRIG 151 (H) 01/07/2023   HDL 56 01/07/2023   CHOLHDL 3.3 01/07/2023   VLDL 30 01/07/2023   LDLCALC 98 01/07/2023    Therapeutic Lab Levels: No results found for: "LITHIUM" No results found for: "VALPROATE" No results found for: "CBMZ"  Physical Findings   PHQ2-9    Flowsheet Row ED from 01/07/2023 in [facility]  PHQ-2 Total Score 5  PHQ-9 Total Score 20      [place] ED from 01/07/2023 in [facility] ED to Hosp-Admission (Discharged) from 11/14/2022 in [place] ED from 10/29/2022 in [facility] Emergency Department at [place] No Risk No Risk         Musculoskeletal  Strength & Muscle Tone: within normal limits Gait & Station: normal Patient leans: N/A  Psychiatric Specialty Exam  Presentation  General Appearance:  Casual; Fairly Groomed  Eye Contact: Fair  Speech: Clear and Coherent; Normal Rate  Speech Volume: Normal  Handedness: Right   Mood and Affect  Mood: Depressed  Affect: Congruent   Thought Process  Thought Processes: Coherent  Descriptions of Associations:Intact  Orientation:Full (Time, Place and Person)  Thought Content:Logical  Diagnosis of Schizophrenia or Schizoaffective disorder in past: No    Hallucinations:Hallucinations: None  Ideas of Reference:None  Suicidal Thoughts:Suicidal Thoughts: Yes, Passive SI Passive Intent and/or Plan: Without Intent; Without Plan; Without Means to Carry Out  Homicidal Thoughts:Homicidal Thoughts: No   Sensorium  Memory: Immediate Good; Recent Good; Remote Good  Judgment: Fair  Insight: Fair   Community education officer  Concentration: Good  Attention Span: Good  Recall: Good  Fund of Knowledge: Good  Language: Good   Psychomotor Activity  Psychomotor Activity: Psychomotor Activity: Normal   Assets  Assets: Communication Skills; Desire for Improvement; Resilience; Social Support; Physical Health   Sleep  Sleep: Sleep: Poor Number of Hours of Sleep: 5   Nutritional Assessment (For OBS and FBC admissions only) Has the patient had a weight loss or gain of 10 pounds or more in the last 3 months?: No Has the patient had a decrease in food intake/or appetite?: No Does the patient have dental problems?: No Does the patient have eating habits or behaviors that may be indicators of an eating disorder including binging or inducing vomiting?: No Has the patient recently lost weight without trying?: 2.0 Has the patient been eating poorly because of a decreased appetite?: 0 Malnutrition Screening Tool Score:  2    Physical Exam  Physical Exam Vitals reviewed.  Constitutional:      General: He is not in acute distress. HENT:     Head: Normocephalic and atraumatic.  Pulmonary:     Effort: Pulmonary effort is normal.  Neurological:     Mental Status: He is alert.    Review of Systems  Constitutional:  Negative for diaphoresis and weight loss.  Gastrointestinal:  Negative for abdominal pain, diarrhea, nausea and vomiting.   Blood pressure Marland Kitchen)  137/90, pulse 97, temperature 98.3 F (36.8 C), temperature source Oral, resp. rate 17, SpO2 98 %. There is no height or weight on file to calculate BMI.  Treatment Plan Summary: Daily contact with patient to assess and evaluate symptoms and progress in treatment and Medication management  Stimulant use disorder Opioid use disorder - START COWS monitoring with Clonidine taper, as DBP >90 x 2.  - Other PRNs -Maalox 30 ml p.o. every 4 hours as needed indigestion -MOM 30 ml p.o. daily as needed constipation -Melatonin 5 mg p.o. nightly as needed sleep -Bentyl 20 mg p.o. every 6 hours as needed spasms, abdominal cramps -Atarax 25 mg p.o. 3 times daily as needed anxiety -Imodium 2- 4 mg p.o. as needed diarrhea or loose stools -Robaxin 500 mg p.o. every 8 hours as needed muscle spasms -Naproxen 500 mg p.o. twice daily as needed aches and pains -Zofran ODT 4 mg p.o. every 6 hours as needed nausea, vomiting   Dispo: Pending  Rosezetta Schlatter, MD 01/08/2023 11:29 PM

## 2023-01-08 NOTE — ED Notes (Signed)
Pt sleeping@this time. Breathing even and unlabored. Will continue to monitor for safety 

## 2023-01-08 NOTE — Discharge Instructions (Addendum)
[facility].  [facility] [address] [place], [place], [address] [phone] phone  New Patient Assessment/Therapy Walk-Ins:  Monday and Wednesday: 8 am until slots are full. Every 1st and 2nd Fridays of the month: 1 pm - 5 pm.  NO ASSESSMENT/THERAPY WALK-INS ON TUESDAYS OR THURSDAYS  New Patient Assessment/Medication Management Walk-Ins:  Monday - Friday:  8 am - 11 am.  For all walk-ins, we ask that you arrive by 7:30 am because patients will be seen in the order of arrival.  Availability is limited; therefore, you may not be seen on the same day that you walk-in.  Our goal is to serve and meet the needs of our community to the best of our ability.  SUBSTANCE USE TREATMENT for Medicaid and State Funded/IPRS  [facility] [address] [place], [place], [address] [phone] phone NOTE: [facility] is no longer offering IOP services.  Serves those who are low-income or have no insurance.  [facility] [address], [place], [place], [address] [phone] phone [phone] fax NOTE: Does have Substance Abuse-Intensive Outpatient Program [facility](SAIOP) as well as transitional housing if eligible.  [facility] [address] [place], [place], [address] [phone] phone [phone] fax  [facility] [phone] [place], [place], [address] [phone] phone [phone] fax  HALFWAY HOUSES:  [facility] [phone]  [facility] www.oxfordvacancies.com  12 STEP PROGRAMS:  Alcoholics Anonymous of [place] [employer].behttps://aagreensboronc.com/meeting  Narcotics Anonymous of [place] Hit[employer]ct.dkhttps://greensborona.org/meetings/  [place]-Anon of [place], [place] www.g[place]on.org/find-meetings.html  Nar-Anon https://nar-anon.org/find-a-meetin  List of Residential placements:   [facility]: [phone]  [facility]: [place], [place]  [phone]: Male and male facility; 30-day program: (uninsured and Medicaid such as [facility], [facility], [facility], partners)  [facility]: [phone]; men and women's facility; 28 days; Can have Medicaid tailored plan Tour manager([facility]iance or Partners)  [facility]: [phone] Karoline Caldwellngie or Larita FifeLynn; 28 day program; must be fully detox; tailored Medicaid or no insurance  [facility] [place], [place]; [phone]; 28 day all males program; no insurance accepted  [facility] in [place]: Gabriel RungJoe [phone] (no insurance or Medicaid only); 90 days; outpatient services but provide housing in apartments downtown [place]  [facility] Admission: [phone]: Patient must complete phone screening for placement: [place]; 6 month program; uninsured, Medicaid, and [employer] insurance.   [facility]: no insurance required; [phone]  [facility]: [phone]; Intake: Molly Maduroobert; Must fill out application online; Alecia LemmingVictor Delay [phone] x [address]  [facility] in [place], [place]: [phone]; Admissions Coordinators Mr. Maurine MinisterDennis or Barron AlvineDavid [place]; 90 day program.  [facility]: [place], [place] [phone]; Co-Ed 9 month to a year program; Online application; Men entry fee is $500 (6-1612months);  [facility]: [address] [place], [place] [address]; no fee or insurance required; minimum of 2 years; Highly structured; work based; Intake Coordinator is Thayer OhmChris [phone]  [facility] in [place], [place]: [phone]; Fax number is [phone]; website: www.[facility].org; Requires 3-6 page autobiography; 2 year program (18 months and then 20month transitional housing); Admission fee is $300; no insurance needed; work Automotive engineerprogram  [facility] in [place], [place]: [employer] CorporationFront Desk Staff: Danise EdgeReeci [phone]: They have a Men's Regenerations Program 6-529months. Free program; There is an initial $300 fee however, they are willing to work  with patients regarding that. Application is online.  [facility]: Admissions [phone] Doran HeaterBenjamin Cox ext [phone]; Any 7-90 day program is out of pocket;  12 month program is free of charge; there is a $275 entry fee; Patient is responsible for own transportation

## 2023-01-09 DIAGNOSIS — R45851 Suicidal ideations: Secondary | ICD-10-CM | POA: Diagnosis not present

## 2023-01-09 DIAGNOSIS — Z1152 Encounter for screening for COVID-19: Secondary | ICD-10-CM | POA: Diagnosis not present

## 2023-01-09 DIAGNOSIS — F319 Bipolar disorder, unspecified: Secondary | ICD-10-CM | POA: Diagnosis not present

## 2023-01-09 DIAGNOSIS — F1910 Other psychoactive substance abuse, uncomplicated: Secondary | ICD-10-CM | POA: Diagnosis not present

## 2023-01-09 NOTE — ED Notes (Signed)
Pt is in the bed sleeping. Respirations are even and unlabored. No acute distress noted. Will continue to monitor for safety. 

## 2023-01-09 NOTE — ED Notes (Signed)
Pt sitting in dayroom interacting with peers. No acute distress noted. No concerns voiced. Informed pt to notify staff with any needs or assistance. Pt verbalized understanding or agreement. Will continue to monitor for safety. 

## 2023-01-09 NOTE — Group Note (Signed)
Group Topic: Fears and Unhealthy Coping Skills  Group Date: 01/09/2023 Start Time: 1100 End Time: 1130 Facilitators: Georgiann Mohs, RN  Department: [facility]  Number of Participants: 8  Group Focus: coping skills Treatment Modality:  Patient-Centered Therapy Interventions utilized were group exercise Purpose: express irrational fears  Name: Ronnie Herman Date of Birth: 1986/06/23  MR: [id number]    Level of Participation: active Quality of Participation: attentive and cooperative Interactions with others: gave feedback Mood/Affect: appropriate and positive Triggers (if applicable):   Cognition: coherent/clear, goal directed, and logical Progress: Gaining insight Response:   Plan: patient will be encouraged to  Continue with treatment  Patients Problems:  Patient Active Problem List   Diagnosis Date Noted   Sepsis due to pneumonia 11/15/2022   Hypokalemia 11/15/2022   Dental infection 11/15/2022   Lactic acidosis 11/15/2022   Pneumonia of both lower lobes due to infectious organism 11/14/2022   Polysubstance abuse 11/14/2022   Bipolar 1 disorder 11/14/2022

## 2023-01-09 NOTE — ED Notes (Signed)
Pt sleeping in no acute distress. RR even and unlabored. Environment secured. Will continue to monitor for safety. 

## 2023-01-09 NOTE — ED Provider Notes (Signed)
Behavioral Health Progress Note  Date and Time: 01/09/2023 11:55 AM Name: Ronnie Herman MRN:  [id number]  Subjective:  Ronnie Herman is a 37 year old male with a psychiatric history of bipolar 1 disorder and polysubstance use (crack cocaine, meth, and marijuana) who was admitted to [facility] 4/1 for detox and assistance with substance use treatment.   On assessment today, patient reiterates that he would like help with residential substance use treatment. He denies withdrawal symptoms at this time but states that now that he has had a moment to reflect, he feels a bit down today. He denies SI, HI, and AVH. He denies having a support system in the area, as family primarily resides in [place]. Patient becomes tearful when discussing a recent stressor of losing his job, and therefore livelihood, at [employer]. He has been homeless for the past 6 months and has found it increasingly difficult to simply live.   Diagnosis:  Final diagnoses:  Polysubstance abuse    Total Time spent with patient: 30 minutes  Past Psychiatric History: Patient has a several forted psychiatric history of bipolar 1 disorder and polysubstance abuse (methamphetamines, cocaine, and marijuana).  Past Medical History: See chart Family History: None reported Social History: "using meth, cocaine, and marijuana daily for the past 4 years. He has participated in residential substance abuse treatment in the past roughly 3-4 years ago. He is unsure of the amount that he is using but he is snorting cocaine and smoking daily. He is injecting methamphetamines daily."  Additional Social History:    Pain Medications: SEE MAR Prescriptions: SEE MAR Over the Counter: SEE MAR History of alcohol / drug use?: Yes Longest period of sobriety (when/how long): UNKNOWN Negative Consequences of Use: Personal relationships, Museum/gallery curator, Work / School Withdrawal Symptoms: None Name of Substance 1: METH 1 - Age of First Use: 31 1 -  Amount (size/oz): 1/2 GRAM 1 - Frequency: DAILY 1 - Duration: ONGOING 1 - Last Use / Amount: TWO DAYS AGO 1 - Method of Aquiring: UNKNOWN 1- Route of Use: SMOKE AND IV Name of Substance 2: COCAINE 2 - Age of First Use: 31 2 - Amount (size/oz): UNKNOWN 2 - Frequency: DAILY 2 - Duration: 2 WEEKS 2 - Last Use / Amount: TODAY 2 - Method of Aquiring: UNKNOWN 2 - Route of Substance Use: SMOKING Name of Substance 3: THC 3 - Age of First Use: 14 3 - Amount (size/oz): 2 BLUNTS 3 - Frequency: DAILY 3 - Duration: ONGOING 3 - Last Use / Amount: TODAY 3 - Method of Aquiring: UNKNOWN 3 - Route of Substance Use: SMOKING              Sleep: Good  Appetite:  Good  Current Medications:  Current Facility-Administered Medications  Medication Dose Route Frequency Provider Last Rate Last Admin   acetaminophen (TYLENOL) tablet 650 mg  650 mg Oral Q6H PRN Revonda Humphrey, NP       alum & mag hydroxide-simeth (MAALOX/MYLANTA) 200-200-20 MG/5ML suspension 30 mL  30 mL Oral Q4H PRN Revonda Humphrey, NP       cloNIDine (CATAPRES) tablet 0.1 mg  0.1 mg Oral QID Rosezetta Schlatter, MD   0.1 mg at 01/10/23 1245   Followed by   Derrill Memo ON 01/11/2023] cloNIDine (CATAPRES) tablet 0.1 mg  0.1 mg Oral Melven Sartorius, MD       Followed by   Derrill Memo ON 01/13/2023] cloNIDine (CATAPRES) tablet 0.1 mg  0.1 mg Oral QAC breakfast Rosezetta Schlatter,  MD       dicyclomine (BENTYL) tablet 20 mg  20 mg Oral Q6H PRN Rosezetta Schlatter, MD       hydrOXYzine (ATARAX) tablet 25 mg  25 mg Oral Q6H PRN Rosezetta Schlatter, MD   25 mg at 01/09/23 2150   hydrOXYzine (ATARAX) tablet 50 mg  50 mg Oral QHS PRN Rosezetta Schlatter, MD       loperamide (IMODIUM) capsule 2-4 mg  2-4 mg Oral PRN Rosezetta Schlatter, MD       magnesium hydroxide (MILK OF MAGNESIA) suspension 30 mL  30 mL Oral Daily PRN Revonda Humphrey, NP   30 mL at 01/09/23 1602   methocarbamol (ROBAXIN) tablet 500 mg  500 mg Oral Q8H PRN Rosezetta Schlatter, MD        naproxen (NAPROSYN) tablet 500 mg  500 mg Oral BID PRN Rosezetta Schlatter, MD       ondansetron (ZOFRAN-ODT) disintegrating tablet 4 mg  4 mg Oral Q6H PRN Rosezetta Schlatter, MD       No current outpatient medications on file.    Labs  Lab Results:  Admission on 01/07/2023  Component Date Value Ref Range Status   WBC 01/07/2023 6.2  4.0 - 10.5 K/uL Final   RBC 01/07/2023 4.48  4.22 - 5.81 MIL/uL Final   Hemoglobin 01/07/2023 13.5  13.0 - 17.0 g/dL Final   HCT 01/07/2023 43.4  39.0 - 52.0 % Final   MCV 01/07/2023 96.9  80.0 - 100.0 fL Final   MCH 01/07/2023 30.1  26.0 - 34.0 pg Final   MCHC 01/07/2023 31.1  30.0 - 36.0 g/dL Final   RDW 01/07/2023 13.2  11.5 - 15.5 % Final   Platelets 01/07/2023 289  150 - 400 K/uL Final   nRBC 01/07/2023 0.0  0.0 - 0.2 % Final   Neutrophils Relative % 01/07/2023 65  % Final   Neutro Abs 01/07/2023 4.1  1.7 - 7.7 K/uL Final   Lymphocytes Relative 01/07/2023 25  % Final   Lymphs Abs 01/07/2023 1.5  0.7 - 4.0 K/uL Final   Monocytes Relative 01/07/2023 7  % Final   Monocytes Absolute 01/07/2023 0.4  0.1 - 1.0 K/uL Final   Eosinophils Relative 01/07/2023 2  % Final   Eosinophils Absolute 01/07/2023 0.1  0.0 - 0.5 K/uL Final   Basophils Relative 01/07/2023 1  % Final   Basophils Absolute 01/07/2023 0.0  0.0 - 0.1 K/uL Final   Immature Granulocytes 01/07/2023 0  % Final   Abs Immature Granulocytes 01/07/2023 0.01  0.00 - 0.07 K/uL Final   Performed at [facility], [place] [address] [place], [place] [address]   Sodium 01/07/2023 138  135 - 145 mmol/L Final   Potassium 01/07/2023 4.1  3.5 - 5.1 mmol/L Final   Chloride 01/07/2023 100  98 - 111 mmol/L Final   CO2 01/07/2023 28  22 - 32 mmol/L Final   Glucose, Bld 01/07/2023 121 (H)  70 - 99 mg/dL Final   Glucose reference range applies only to samples taken after fasting for at least 8 hours.   BUN 01/07/2023 12  6 - 20 mg/dL Final   Creatinine, Ser 01/07/2023 0.86  0.61 - 1.24 mg/dL Final   Calcium  01/07/2023 9.3  8.9 - 10.3 mg/dL Final   Total Protein 01/07/2023 6.3 (L)  6.5 - 8.1 g/dL Final   Albumin 01/07/2023 3.8  3.5 - 5.0 g/dL Final   AST 01/07/2023 19  15 - 41 U/L Final  ALT 01/07/2023 17  0 - 44 U/L Final   Alkaline Phosphatase 01/07/2023 76  38 - 126 U/L Final   Total Bilirubin 01/07/2023 0.6  0.3 - 1.2 mg/dL Final   GFR, Estimated 01/07/2023 >60  >60 mL/min Final   Comment: (NOTE) Calculated using the CKD-EPI Creatinine Equation (2021)    Anion gap 01/07/2023 10  5 - 15 Final   Performed at [place] [address], [place], [place] [address]   Hgb A1c MFr Bld 01/07/2023 4.9  4.8 - 5.6 % Final   Comment: (NOTE)         Prediabetes: 5.7 - 6.4         Diabetes: >6.4         Glycemic control for adults with diabetes: <7.0    Mean Plasma Glucose 01/07/2023 94  mg/dL Final   Comment: (NOTE) Performed At: [facility] [place], [place] [id number] Rush Farmer MD [id number]    Magnesium 01/07/2023 2.2  1.7 - 2.4 mg/dL Final   Performed at [facility], [place] [address], [place], [place] [address]   Alcohol, Ethyl (B) 01/07/2023 <10  <10 mg/dL Final   Comment: (NOTE) Lowest detectable limit for serum alcohol is 10 mg/dL.  For medical purposes only. Performed at [facility], [place] [address] [place], [place] [address]    Cholesterol 01/07/2023 184  0 - 200 mg/dL Final   Triglycerides 01/07/2023 151 (H)  <150 mg/dL Final   HDL 01/07/2023 56  >40 mg/dL Final   Total CHOL/HDL Ratio 01/07/2023 3.3  RATIO Final   VLDL 01/07/2023 30  0 - 40 mg/dL Final   LDL Cholesterol 01/07/2023 98  0 - 99 mg/dL Final   Comment:        Total Cholesterol/HDL:CHD Risk Coronary Heart Disease Risk Table                     Men   Women  1/2 Average Risk   3.4   3.3  Average Risk       5.0   4.4  2 X Average Risk   9.6   7.1  3 X Average Risk  23.4   11.0        Use the calculated Patient Ratio above and the CHD Risk Table to determine  the patient's CHD Risk.        ATP III CLASSIFICATION (LDL):  <100     mg/dL   Optimal  100-129  mg/dL   Near or Above                    Optimal  130-159  mg/dL   Borderline  160-189  mg/dL   High  >190     mg/dL   Very High Performed at [place] [address], [place], [place] [address]    TSH 01/07/2023 0.336 (L)  0.350 - 4.500 uIU/mL Final   Comment: Performed by a 3rd Generation assay with a functional sensitivity of <=0.01 uIU/mL. Performed at [facility], [place] [address] [place], [place] [address]    Chlamydia 01/07/2023 Negative   Final   Neisseria Gonorrhea 01/07/2023 Negative   Final   Comment 01/07/2023 Normal Reference Ranger Chlamydia - Negative   Final   Comment 01/07/2023 Normal Reference Range Neisseria Gonorrhea - Negative   Final   RPR Ser Ql 01/07/2023 NON REACTIVE  NON REACTIVE Final   Performed  at [facility], [place] [address] [place], [place] [address]   POC Amphetamine UR 01/07/2023 None Detected  NONE DETECTED (Cut Off Level 1000 ng/mL) Final   POC Secobarbital (BAR) 01/07/2023 None Detected  NONE DETECTED (Cut Off Level 300 ng/mL) Final   POC Buprenorphine (BUP) 01/07/2023 None Detected  NONE DETECTED (Cut Off Level 10 ng/mL) Final   POC Oxazepam (BZO) 01/07/2023 None Detected  NONE DETECTED (Cut Off Level 300 ng/mL) Final   POC Cocaine UR 01/07/2023 Positive (A)  NONE DETECTED (Cut Off Level 300 ng/mL) Final   POC Methamphetamine UR 01/07/2023 Positive (A)  NONE DETECTED (Cut Off Level 1000 ng/mL) Final   POC Morphine 01/07/2023 None Detected  NONE DETECTED (Cut Off Level 300 ng/mL) Final   POC Methadone UR 01/07/2023 None Detected  NONE DETECTED (Cut Off Level 300 ng/mL) Final   POC Oxycodone UR 01/07/2023 None Detected  NONE DETECTED (Cut Off Level 100 ng/mL) Final   POC Marijuana UR 01/07/2023 Positive (A)  NONE DETECTED (Cut Off Level 50 ng/mL) Final   Color, Urine 01/07/2023 YELLOW  YELLOW Final   APPearance 01/07/2023 CLEAR   CLEAR Final   Specific Gravity, Urine 01/07/2023 1.026  1.005 - 1.030 Final   pH 01/07/2023 5.0  5.0 - 8.0 Final   Glucose, UA 01/07/2023 NEGATIVE  NEGATIVE mg/dL Final   Hgb urine dipstick 01/07/2023 NEGATIVE  NEGATIVE Final   Bilirubin Urine 01/07/2023 NEGATIVE  NEGATIVE Final   Ketones, ur 01/07/2023 5 (A)  NEGATIVE mg/dL Final   Protein, ur 01/07/2023 NEGATIVE  NEGATIVE mg/dL Final   Nitrite 01/07/2023 NEGATIVE  NEGATIVE Final   Leukocytes,Ua 01/07/2023 NEGATIVE  NEGATIVE Final   Performed at [facility], [place] [address], [place], [place] [address]   HIV Screen 4th Generation wRfx 01/07/2023 Non Reactive  Non Reactive Final   Performed at [facility], [place] [address] [place], [place] [address]   SARSCOV2ONAVIRUS 2 AG 01/07/2023 NEGATIVE  NEGATIVE Final   Comment: (NOTE) SARS-CoV-2 antigen NOT DETECTED.   Negative results are presumptive.  Negative results do not preclude SARS-CoV-2 infection and should not be used as the sole basis for treatment or other patient management decisions, including infection  control decisions, particularly in the presence of clinical signs and  symptoms consistent with COVID-19, or in those who have been in contact with the virus.  Negative results must be combined with clinical observations, patient history, and epidemiological information. The expected result is Negative.  Fact Sheet for Patients: HandmadeRecipes.com.cy  Fact Sheet for Healthcare Providers: FuneralLife.at  This test is not yet approved or cleared by the [place] FDA and  has been authorized for detection and/or diagnosis of SARS-CoV-2 by FDA under an Emergency Use Authorization (EUA).  This EUA will remain in effect (meaning this test can be used) for the duration of  the COV                          ID-19 declaration under Section 564(b)(1) of the Act, 21 U.S.C. section 360bbb-3(b)(1), unless the authorization  is terminated or revoked sooner.     SARS Coronavirus 2 by RT PCR 01/07/2023 NEGATIVE  NEGATIVE Final   Performed at [facility], [place] [address] [place], [place] [address]  Admission on 11/14/2022, Discharged on 11/16/2022  Component Date Value Ref Range Status   Sodium 11/14/2022 134 (L)  135 - 145 mmol/L Final   Potassium 11/14/2022 3.5  3.5 - 5.1 mmol/L Final   Chloride 11/14/2022 95 (L)  98 - 111 mmol/L Final   CO2 11/14/2022 27  22 - 32 mmol/L Final   Glucose, Bld 11/14/2022 104 (H)  70 - 99 mg/dL Final   Glucose reference range applies only to samples taken after fasting for at least 8 hours.   BUN 11/14/2022 15  6 - 20 mg/dL Final   Creatinine, Ser 11/14/2022 1.19  0.61 - 1.24 mg/dL Final   Calcium 11/14/2022 9.0  8.9 - 10.3 mg/dL Final   GFR, Estimated 11/14/2022 >60  >60 mL/min Final   Comment: (NOTE) Calculated using the CKD-EPI Creatinine Equation (2021)    Anion gap 11/14/2022 12  5 - 15 Final   Performed at [facility], [place] [address] [place], [place] [address]   WBC 11/14/2022 25.4 (H)  4.0 - 10.5 K/uL Final   RBC 11/14/2022 4.30  4.22 - 5.81 MIL/uL Final   Hemoglobin 11/14/2022 13.4  13.0 - 17.0 g/dL Final   HCT 11/14/2022 41.8  39.0 - 52.0 % Final   MCV 11/14/2022 97.2  80.0 - 100.0 fL Final   MCH 11/14/2022 31.2  26.0 - 34.0 pg Final   MCHC 11/14/2022 32.1  30.0 - 36.0 g/dL Final   RDW 11/14/2022 13.3  11.5 - 15.5 % Final   Platelets 11/14/2022 325  150 - 400 K/uL Final   nRBC 11/14/2022 0.0  0.0 - 0.2 % Final   Performed at [facility], [place] [address] [place], [place] [address]   Troponin I (High Sensitivity) 11/14/2022 2  <18 ng/L Final   Comment: (NOTE) Elevated high sensitivity troponin I (hsTnI) values and significant  changes across serial measurements may suggest ACS but many other  chronic and acute conditions are known to elevate hsTnI results.  Refer to the "Links" section for chest pain algorithms and  additional  guidance. Performed at [facility], [place] [address] [place], [place] [address]    SARS Coronavirus 2 by RT PCR 11/14/2022 NEGATIVE  NEGATIVE Final   Comment: (NOTE) SARS-CoV-2 target nucleic acids are NOT DETECTED.  The SARS-CoV-2 RNA is generally detectable in upper respiratory specimens during the acute phase of infection. The lowest concentration of SARS-CoV-2 viral copies this assay can detect is 138 copies/mL. A negative result does not preclude SARS-Cov-2 infection and should not be used as the sole basis for treatment or other patient management decisions. A negative result may occur with  improper specimen collection/handling, submission of specimen other than nasopharyngeal swab, presence of viral mutation(s) within the areas targeted by this assay, and inadequate number of viral copies(<138 copies/mL). A negative result must be combined with clinical observations, patient history, and epidemiological information. The expected result is Negative.  Fact Sheet for Patients:  EntrepreneurPulse.com.au  Fact Sheet for Healthcare Providers:  IncredibleEmployment.be  This test is no                          t yet approved or cleared by the [place] FDA and  has been authorized for detection and/or diagnosis of SARS-CoV-2 by FDA under an Emergency Use Authorization (EUA). This EUA will remain  in effect (meaning this test can be used) for the duration of the COVID-19 declaration under Section 564(b)(1) of the Act, 21 U.S.C.section 360bbb-3(b)(1), unless the authorization is terminated  or revoked sooner.       Influenza A by PCR 11/14/2022 NEGATIVE  NEGATIVE Final   Influenza B by PCR 11/14/2022 NEGATIVE  NEGATIVE Final   Comment: (NOTE) The Xpert Xpress SARS-CoV-2/FLU/RSV plus assay is intended as an aid in the diagnosis of influenza from Nasopharyngeal swab specimens and should not be used as a sole  basis for treatment. Nasal washings and aspirates are unacceptable for Xpert Xpress SARS-CoV-2/FLU/RSV testing.  Fact Sheet for Patients: EntrepreneurPulse.com.au  Fact Sheet for Healthcare Providers: IncredibleEmployment.be  This test is not yet approved or cleared by the [place] FDA and has been authorized for detection and/or diagnosis of SARS-CoV-2 by FDA under an Emergency Use Authorization (EUA). This EUA will remain in effect (meaning this test can be used) for the duration of the COVID-19 declaration under Section 564(b)(1) of the Act, 21 U.S.C. section 360bbb-3(b)(1), unless the authorization is terminated or revoked.     Resp Syncytial Virus by PCR 11/14/2022 NEGATIVE  NEGATIVE Final   Comment: (NOTE) Fact Sheet for Patients: EntrepreneurPulse.com.au  Fact Sheet for Healthcare Providers: IncredibleEmployment.be  This test is not yet approved or cleared by the [place] FDA and has been authorized for detection and/or diagnosis of SARS-CoV-2 by FDA under an Emergency Use Authorization (EUA). This EUA will remain in effect (meaning this test can be used) for the duration of the COVID-19 declaration under Section 564(b)(1) of the Act, 21 U.S.C. section 360bbb-3(b)(1), unless the authorization is terminated or revoked.  Performed at [facility], [place] [address], [place], [place] [address]    D-Dimer, Quant 11/14/2022 1.78 (H)  0.00 - 0.50 ug/mL-FEU Final   Comment: (NOTE) At the manufacturer cut-off value of 0.5 g/mL FEU, this assay has a negative predictive value of 95-100%.This assay is intended for use in conjunction with a clinical pretest probability (PTP) assessment model to exclude pulmonary embolism (PE) and deep venous thrombosis (DVT) in outpatients suspected of PE or DVT. Results should be correlated with clinical presentation. Performed at [facility], [place] [address] [place], [place] [address]    Opiates 11/14/2022 NONE DETECTED  NONE DETECTED Final   Cocaine 11/14/2022 NONE DETECTED  NONE DETECTED Final   Benzodiazepines 11/14/2022 NONE DETECTED  NONE DETECTED Final   Amphetamines 11/14/2022 POSITIVE (A)  NONE DETECTED Final   Tetrahydrocannabinol 11/14/2022 POSITIVE (A)  NONE DETECTED Final   Barbiturates 11/14/2022 NONE DETECTED  NONE DETECTED Final   Comment: (NOTE) DRUG SCREEN FOR MEDICAL PURPOSES ONLY.  IF CONFIRMATION IS NEEDED FOR ANY PURPOSE, NOTIFY LAB WITHIN 5 DAYS.  LOWEST DETECTABLE LIMITS FOR URINE DRUG SCREEN Drug Class                     Cutoff (ng/mL) Amphetamine and metabolites    1000 Barbiturate and metabolites    200 Benzodiazepine                 200 Opiates and metabolites        300 Cocaine and metabolites        300 THC                            50 Performed at [facility], [place] [address], [place], [place] [address]    Troponin I (High Sensitivity) 11/14/2022 3  <18 ng/L Final   Comment: (NOTE) Elevated high sensitivity troponin I (hsTnI) values and significant  changes across serial measurements may suggest ACS but many other  chronic and acute conditions are known to elevate  hsTnI results.  Refer to the "Links" section for chest pain algorithms and additional  guidance. Performed at [facility], [place] [address], [place], [place] [address]    HIV Screen 4th Generation wRfx 11/14/2022 Non Reactive  Non Reactive Final   Performed at [facility], [place] [address], [place], [place] [address]   Strep Pneumo Urinary Antigen 11/14/2022 NEGATIVE  NEGATIVE Final   Comment:        Infection due to S. pneumoniae cannot be absolutely ruled out since the antigen present may be below the detection limit of the test. Performed at [facility], [address] [place], [place] [address]    L. pneumophila Serogp 1 Ur Ag 11/14/2022  Negative  Negative Final   Comment: (NOTE) Presumptive negative for L. pneumophila serogroup 1 antigen in urine, suggesting no recent or current infection. Legionnaires' disease cannot be ruled out since other serogroups and species may also cause disease. Performed At: [facility] [place], [place] [id number] Rush Farmer MD [id number]    Source of Sample 11/14/2022 URINE, CLEAN CATCH   Final   Performed at [facility], [place] [address] [place], [place] [address]   Sodium 11/15/2022 133 (L)  135 - 145 mmol/L Final   Potassium 11/15/2022 3.3 (L)  3.5 - 5.1 mmol/L Final   Chloride 11/15/2022 99  98 - 111 mmol/L Final   CO2 11/15/2022 22  22 - 32 mmol/L Final   Glucose, Bld 11/15/2022 116 (H)  70 - 99 mg/dL Final   Glucose reference range applies only to samples taken after fasting for at least 8 hours.   BUN 11/15/2022 10  6 - 20 mg/dL Final   Creatinine, Ser 11/15/2022 0.73  0.61 - 1.24 mg/dL Final   Calcium 11/15/2022 7.9 (L)  8.9 - 10.3 mg/dL Final   Total Protein 11/15/2022 5.9 (L)  6.5 - 8.1 g/dL Final   Albumin 11/15/2022 2.9 (L)  3.5 - 5.0 g/dL Final   AST 11/15/2022 22  15 - 41 U/L Final   ALT 11/15/2022 22  0 - 44 U/L Final   Alkaline Phosphatase 11/15/2022 68  38 - 126 U/L Final   Total Bilirubin 11/15/2022 1.7 (H)  0.3 - 1.2 mg/dL Final   GFR, Estimated 11/15/2022 >60  >60 mL/min Final   Comment: (NOTE) Calculated using the CKD-EPI Creatinine Equation (2021)    Anion gap 11/15/2022 12  5 - 15 Final   Performed at [facility], [place] [address], [place], [place] [address]   WBC 11/15/2022 25.4 (H)  4.0 - 10.5 K/uL Final   RBC 11/15/2022 3.27 (L)  4.22 - 5.81 MIL/uL Final   Hemoglobin 11/15/2022 10.2 (L)  13.0 - 17.0 g/dL Final   HCT 11/15/2022 32.0 (L)  39.0 - 52.0 % Final   MCV 11/15/2022 97.9  80.0 - 100.0 fL Final   MCH 11/15/2022 31.2  26.0 - 34.0 pg Final   MCHC 11/15/2022 31.9  30.0 - 36.0 g/dL Final    RDW 11/15/2022 13.5  11.5 - 15.5 % Final   Platelets 11/15/2022 245  150 - 400 K/uL Final   nRBC 11/15/2022 0.0  0.0 - 0.2 % Final   Neutrophils Relative % 11/15/2022 87  % Final   Neutro Abs 11/15/2022 21.9 (H)  1.7 - 7.7 K/uL Final   Lymphocytes Relative 11/15/2022 4  % Final   Lymphs Abs 11/15/2022 1.0  0.7 - 4.0 K/uL Final  Monocytes Relative 11/15/2022 6  % Final   Monocytes Absolute 11/15/2022 1.6 (H)  0.1 - 1.0 K/uL Final   Eosinophils Relative 11/15/2022 0  % Final   Eosinophils Absolute 11/15/2022 0.0  0.0 - 0.5 K/uL Final   Basophils Relative 11/15/2022 0  % Final   Basophils Absolute 11/15/2022 0.1  0.0 - 0.1 K/uL Final   Immature Granulocytes 11/15/2022 3  % Final   Abs Immature Granulocytes 11/15/2022 0.75 (H)  0.00 - 0.07 K/uL Final   Performed at [facility], [place] [address], [place], [place] [address]   Phosphorus 11/15/2022 3.3  2.5 - 4.6 mg/dL Final   Performed at [facility], [place] [address], [place], [place] [address]   Magnesium 11/15/2022 1.8  1.7 - 2.4 mg/dL Final   Performed at [place] [address], [place], [place] [address]   Specimen Description 11/14/2022    Final                   Value:BLOOD RIGHT ANTECUBITAL Performed at [place] [address] [place], [place] [address]    Special Requests 11/14/2022    Final                   Value:BOTTLES DRAWN AEROBIC AND ANAEROBIC Blood Culture adequate volume Performed at [place] [address] [place], [place] [address]    Culture 11/14/2022    Final                   Value:NO GROWTH 5 DAYS Performed at [place] [address] [place], [place] [address]    Report Status 11/14/2022 11/19/2022 FINAL   Final   Specimen Description 11/14/2022    Final                   Value:BLOOD BLOOD RIGHT HAND Performed at [facility], [place] [address] [place], [place]  [address]    Special Requests 11/14/2022    Final                   Value:BOTTLES DRAWN AEROBIC AND ANAEROBIC Blood Culture adequate volume Performed at [place] [address], [place], [place] [address]    Culture 11/14/2022    Final                   Value:NO GROWTH 5 DAYS Performed at [place] [address], [place], [place] [address]    Report Status 11/14/2022 11/20/2022 FINAL   Final   Lactic Acid, Venous 11/15/2022 1.1  0.5 - 1.9 mmol/L Final   Performed at [place] [address] [place], [place] [address]   Lactic Acid, Venous 11/15/2022 3.0 (HH)  0.5 - 1.9 mmol/L Final   Comment: CRITICAL RESULT CALLED TO, READ BACK BY AND VERIFIED WITH DARK,A. RN AT 1132 11/15/22 MULLINS,T Performed at [facility], [place] [address], [place],  [address]    MRSA by PCR Next Gen 11/15/2022 NOT DETECTED  NOT DETECTED Final   Comment: (NOTE) The GeneXpert MRSA Assay (FDA approved for NASAL specimens only), is one component of a comprehensive MRSA colonization surveillance program. It is not intended to diagnose MRSA infection nor to guide or monitor treatment for MRSA infections. Test performance is not FDA approved in patients less than 22 years old. Performed at [facility], [address] [place], [place] [address]    Lactic Acid, Venous 11/15/2022 1.3  0.5 - 1.9 mmol/L Final   Performed at [facility] [address] [place], [place] [address]   WBC 11/16/2022 13.9 (H)  4.0 - 10.5 K/uL Final   RBC 11/16/2022 3.12 (L)  4.22 - 5.81 MIL/uL Final   Hemoglobin 11/16/2022 9.7 (L)  13.0 - 17.0 g/dL Final   HCT 11/16/2022 30.5 (L)  39.0 - 52.0 % Final   MCV 11/16/2022 97.8  80.0 - 100.0 fL Final   MCH 11/16/2022 31.1  26.0 - 34.0 pg Final   MCHC 11/16/2022 31.8  30.0 - 36.0 g/dL Final   RDW 11/16/2022 13.5  11.5 - 15.5 % Final   Platelets 11/16/2022 243  150 - 400 K/uL  Final   nRBC 11/16/2022 0.0  0.0 - 0.2 % Final   Neutrophils Relative % 11/16/2022 76  % Final   Neutro Abs 11/16/2022 10.6 (H)  1.7 - 7.7 K/uL Final   Lymphocytes Relative 11/16/2022 12  % Final   Lymphs Abs 11/16/2022 1.6  0.7 - 4.0 K/uL Final   Monocytes Relative 11/16/2022 10  % Final   Monocytes Absolute 11/16/2022 1.4 (H)  0.1 - 1.0 K/uL Final   Eosinophils Relative 11/16/2022 1  % Final   Eosinophils Absolute 11/16/2022 0.1  0.0 - 0.5 K/uL Final   Basophils Relative 11/16/2022 0  % Final   Basophils Absolute 11/16/2022 0.0  0.0 - 0.1 K/uL Final   Immature Granulocytes 11/16/2022 1  % Final   Abs Immature Granulocytes 11/16/2022 0.11 (H)  0.00 - 0.07 K/uL Final   Performed at [facility], [place] [address] [place], [place] [address]   Sodium 11/16/2022 135  135 - 145 mmol/L Final   Potassium 11/16/2022 3.8  3.5 - 5.1 mmol/L Final   Chloride 11/16/2022 103  98 - 111 mmol/L Final   CO2 11/16/2022 23  22 - 32 mmol/L Final   Glucose, Bld 11/16/2022 88  70 - 99 mg/dL Final   Glucose reference range applies only to samples taken after fasting for at least 8 hours.   BUN 11/16/2022 8  6 - 20 mg/dL Final   Creatinine, Ser 11/16/2022 0.75  0.61 - 1.24 mg/dL Final   Calcium 11/16/2022 7.8 (L)  8.9 - 10.3 mg/dL Final   Total Protein 11/16/2022 6.0 (L)  6.5 - 8.1 g/dL Final   Albumin 11/16/2022 2.5 (L)  3.5 - 5.0 g/dL Final   AST 11/16/2022 13 (L)  15 - 41 U/L Final   ALT 11/16/2022 15  0 - 44 U/L Final   Alkaline Phosphatase 11/16/2022 57  38 - 126 U/L Final   Total Bilirubin 11/16/2022 0.7  0.3 - 1.2 mg/dL Final   GFR, Estimated 11/16/2022 >60  >60 mL/min Final   Comment: (NOTE) Calculated using the CKD-EPI Creatinine Equation (2021)    Anion gap 11/16/2022 9  5 - 15 Final   Performed at [facility], [place] [address] [place], [place] [address]   Magnesium 11/16/2022 2.0  1.7 - 2.4 mg/dL Final   Performed at [facility]  [address] [place], [place] [address]   Procalcitonin 11/16/2022 4.93  ng/mL Final   Comment:        Interpretation: PCT > 2 ng/mL: Systemic infection (sepsis) is likely, unless other causes are known. (NOTE)       Sepsis PCT Algorithm  Lower Respiratory Tract                                      Infection PCT Algorithm    ----------------------------     ----------------------------         PCT < 0.25 ng/mL                PCT < 0.10 ng/mL          Strongly encourage             Strongly discourage   discontinuation of antibiotics    initiation of antibiotics    ----------------------------     -----------------------------       PCT 0.25 - 0.50 ng/mL            PCT 0.10 - 0.25 ng/mL               OR       >80% decrease in PCT            Discourage initiation of                                            antibiotics      Encourage discontinuation           of antibiotics    ----------------------------     -----------------------------         PCT >= 0.50 ng/mL              PCT 0.26 - 0.50 ng/mL               AND       <80% decrease in PCT                                       Encourage initiation of                                             antibiotics       Encourage continuation           of antibiotics    ----------------------------     -----------------------------        PCT >= 0.50 ng/mL                  PCT > 0.50 ng/mL               AND         increase in PCT                  Strongly encourage                                      initiation of antibiotics    Strongly encourage escalation           of antibiotics                                     -----------------------------  PCT <= 0.25 ng/mL                                                 OR                                        > 80% decrease in PCT                                      Discontinue / Do not initiate                                              antibiotics  Performed at [facility] [address], [place], [place] [address]   Admission on 10/29/2022, Discharged on 10/30/2022  Component Date Value Ref Range Status   SARS Coronavirus 2 by RT PCR 10/29/2022 NEGATIVE  NEGATIVE Final   Comment: (NOTE) SARS-CoV-2 target nucleic acids are NOT DETECTED.  The SARS-CoV-2 RNA is generally detectable in upper respiratory specimens during the acute phase of infection. The lowest concentration of SARS-CoV-2 viral copies this assay can detect is 138 copies/mL. A negative result does not preclude SARS-Cov-2 infection and should not be used as the sole basis for treatment or other patient management decisions. A negative result may occur with  improper specimen collection/handling, submission of specimen other than nasopharyngeal swab, presence of viral mutation(s) within the areas targeted by this assay, and inadequate number of viral copies(<138 copies/mL). A negative result must be combined with clinical observations, patient history, and epidemiological information. The expected result is Negative.  Fact Sheet for Patients:  EntrepreneurPulse.com.au  Fact Sheet for Healthcare Providers:  IncredibleEmployment.be  This test is no                          t yet approved or cleared by the [place] FDA and  has been authorized for detection and/or diagnosis of SARS-CoV-2 by FDA under an Emergency Use Authorization (EUA). This EUA will remain  in effect (meaning this test can be used) for the duration of the COVID-19 declaration under Section 564(b)(1) of the Act, 21 U.S.C.section 360bbb-3(b)(1), unless the authorization is terminated  or revoked sooner.       Influenza A by PCR 10/29/2022 NEGATIVE  NEGATIVE Final   Influenza B by PCR 10/29/2022 NEGATIVE  NEGATIVE Final   Comment: (NOTE) The Xpert Xpress SARS-CoV-2/FLU/RSV plus assay is intended as an aid in the  diagnosis of influenza from Nasopharyngeal swab specimens and should not be used as a sole basis for treatment. Nasal washings and aspirates are unacceptable for Xpert Xpress SARS-CoV-2/FLU/RSV testing.  Fact Sheet for Patients: EntrepreneurPulse.com.au  Fact Sheet for Healthcare Providers: IncredibleEmployment.be  This test is not yet approved or cleared by the [place] FDA and has been authorized for detection and/or diagnosis of SARS-CoV-2 by FDA under an Emergency Use Authorization (EUA). This EUA will remain in effect (meaning this test can be used) for the duration of the COVID-19 declaration under Section 564(b)(1) of  the Act, 21 U.S.C. section 360bbb-3(b)(1), unless the authorization is terminated or revoked.     Resp Syncytial Virus by PCR 10/29/2022 NEGATIVE  NEGATIVE Final   Comment: (NOTE) Fact Sheet for Patients: EntrepreneurPulse.com.au  Fact Sheet for Healthcare Providers: IncredibleEmployment.be  This test is not yet approved or cleared by the [place] FDA and has been authorized for detection and/or diagnosis of SARS-CoV-2 by FDA under an Emergency Use Authorization (EUA). This EUA will remain in effect (meaning this test can be used) for the duration of the COVID-19 declaration under Section 564(b)(1) of the Act, 21 U.S.C. section 360bbb-3(b)(1), unless the authorization is terminated or revoked.  Performed at [facility], [place] [address], [place], [place] [address]    Group A Strep by PCR 10/29/2022 NOT DETECTED  NOT DETECTED Final   Performed at [facility] [address], [place], [place] [address]    Blood Alcohol level:  Lab Results  Component Value Date   ETH <10 [id number]    Metabolic Disorder Labs: Lab Results  Component Value Date   HGBA1C 4.9 01/07/2023   MPG 94 01/07/2023   No results found for: "PROLACTIN" Lab  Results  Component Value Date   CHOL 184 01/07/2023   TRIG 151 (H) 01/07/2023   HDL 56 01/07/2023   CHOLHDL 3.3 01/07/2023   VLDL 30 01/07/2023   LDLCALC 98 01/07/2023    Therapeutic Lab Levels: No results found for: "LITHIUM" No results found for: "VALPROATE" No results found for: "CBMZ"  Physical Findings   PHQ2-9    [facility] from 01/07/2023 in [facility]  PHQ-2 Total Score 2  PHQ-9 Total Score 11      [facility] from 01/07/2023 in [facility] ED to Hosp-Admission (Discharged) from 11/14/2022 in [place] ED from 10/29/2022 in [facility] Department at [place] No Risk No Risk        Musculoskeletal  Strength & Muscle Tone: within normal limits Gait & Station: normal Patient leans: N/A  Psychiatric Specialty Exam  Presentation  General Appearance:  Appropriate for Environment; Casual  Eye Contact: Fair  Speech: Clear and Coherent; Normal Rate  Speech Volume: Normal  Handedness: Right   Mood and Affect  Mood: Depressed  Affect: Congruent; Depressed; Tearful   Thought Process  Thought Processes: Coherent; Goal Directed; Linear  Descriptions of Associations:Intact  Orientation:Full (Time, Place and Person)  Thought Content:WDL; Logical  Diagnosis of Schizophrenia or Schizoaffective disorder in past: No    Hallucinations:Hallucinations: None   Ideas of Reference:None  Suicidal Thoughts:Suicidal Thoughts: Yes, Passive SI Passive Intent and/or Plan: Without Intent; Without Plan   Homicidal Thoughts:Homicidal Thoughts: No    Sensorium  Memory: Immediate Good; Recent Good  Judgment: Fair  Insight: Fair   Executive Functions  Concentration: Good  Attention Span: Good  Recall: Good  Fund of Knowledge: Good  Language: Good   Psychomotor Activity   Psychomotor Activity: Psychomotor Activity: Normal    Assets  Assets: Communication Skills; Desire for Improvement; Physical Health; Social Support; Vocational/Educational; Resilience   Sleep  Sleep: Sleep: Fair    Physical Exam  Physical Exam Vitals reviewed.  Constitutional:      General: He is not in acute distress. HENT:     Head: Normocephalic and atraumatic.  Pulmonary:     Effort: Pulmonary effort is normal.  Neurological:  Mental Status: He is alert.    Review of Systems  Constitutional:  Negative for diaphoresis and weight loss.  Gastrointestinal:  Negative for abdominal pain, diarrhea, nausea and vomiting.   Blood pressure 102/73, pulse 70, temperature 97.8 F (36.6 C), temperature source Oral, resp. rate 20, weight 185 lb (83.9 kg), SpO2 100 %. Body mass index is 25.09 kg/m.  Treatment Plan Summary: Daily contact with patient to assess and evaluate symptoms and progress in treatment and Medication management  Stimulant use disorder Opioid use disorder - START COWS monitoring with Clonidine taper, as DBP >90 x 2.  - Other PRNs -Maalox 30 ml p.o. every 4 hours as needed indigestion -MOM 30 ml p.o. daily as needed constipation -Melatonin 5 mg p.o. nightly as needed sleep -Bentyl 20 mg p.o. every 6 hours as needed spasms, abdominal cramps -Atarax 25 mg p.o. 3 times daily as needed anxiety -Imodium 2- 4 mg p.o. as needed diarrhea or loose stools -Robaxin 500 mg p.o. every 8 hours as needed muscle spasms -Naproxen 500 mg p.o. twice daily as needed aches and pains -Zofran ODT 4 mg p.o. every 6 hours as needed nausea, vomiting   Dispo: Pending  Rosezetta Schlatter, MD 01/09/2023 11:55 AM

## 2023-01-09 NOTE — ED Notes (Addendum)
Patient A&Ox4. Denies intent to harm self/others when asked. Denies A/VH. Patient denies any physical complaints when asked. Pt states, "I need my Seroquel. I slept horrible last night from that Trazodone. It did absolutely nothing for me". Support provided. Informed pt that Probation officer would notify MD of pt request. Pt verbalized acceptance. MD notified via secure chat. Routine safety checks conducted according to facility protocol. Encouraged patient to notify staff if thoughts of harm toward self or others arise. Patient verbalize understanding and agreement. Will continue to monitor for safety.

## 2023-01-10 DIAGNOSIS — R45851 Suicidal ideations: Secondary | ICD-10-CM | POA: Diagnosis not present

## 2023-01-10 DIAGNOSIS — Z1152 Encounter for screening for COVID-19: Secondary | ICD-10-CM | POA: Diagnosis not present

## 2023-01-10 DIAGNOSIS — F1910 Other psychoactive substance abuse, uncomplicated: Secondary | ICD-10-CM | POA: Diagnosis not present

## 2023-01-10 DIAGNOSIS — F319 Bipolar disorder, unspecified: Secondary | ICD-10-CM | POA: Diagnosis not present

## 2023-01-10 MED ORDER — HYDROXYZINE HCL 25 MG PO TABS
50.0000 mg | ORAL_TABLET | Freq: Every evening | ORAL | Status: DC | PRN
  Administered 2023-01-12 – 2023-01-13 (×2): 50 mg via ORAL
  Filled 2023-01-10: qty 2
  Filled 2023-01-10: qty 28
  Filled 2023-01-10 (×2): qty 2

## 2023-01-10 MED ORDER — CLONIDINE HCL 0.1 MG PO TABS: 0.1000 mg | ORAL_TABLET | Freq: Four times a day (QID) | ORAL | Status: DC | PRN

## 2023-01-10 NOTE — ED Notes (Signed)
Patient was lying in the bed resting. Patient denies SI/HI and AVH. Patient is being monitored for safety.

## 2023-01-10 NOTE — ED Notes (Addendum)
Patient is sleeping. Respirations equal and unlabored, skin warm and dry. No change in assessment or acuity. Routine safety checks conducted according to facility protocol. Will continue to monitor for safety.

## 2023-01-10 NOTE — ED Notes (Signed)
Pt sleeping in no acute distress. RR even and unlabored. Environment secured. Will continue to monitor for safety. 

## 2023-01-10 NOTE — Discharge Planning (Signed)
LCSW received phone call back from Admissions Coordinator Enis Gash at [facility] regarding referral. Per Enis Gash, patient is currently under review and insurance benefits are being verified. Per Enis Gash, she will call back in 78mins to provide update.   LCSW contacted [facility] regarding patient referral. Per Arlita, patient has been denied services as he has a standard medicaid plan that is not accepted.   Update will be provided to patient and LCSW will explore additional resources for follow up in the case [facility] does not accept.   Lucius Conn, LCSW Clinical Social Worker [facility] Ph: [phone]

## 2023-01-10 NOTE — ED Notes (Signed)
Patient A&Ox4. Denies intent to harm self/others when asked. Denies A/VH. Patient denies any physical complaints when asked. No acute distress noted. Routine safety checks conducted according to facility protocol. Encouraged patient to notify staff if thoughts of harm toward self or others arise. Patient verbalize understanding and agreement. Will continue to monitor for safety.    

## 2023-01-10 NOTE — ED Notes (Signed)
Patient is sleeping. Respirations equal and unlabored, skin warm and dry, NAD. No change in assessment or acuity. Routine safety checks conducted according to facility protocol. Will continue to monitor for safety.   

## 2023-01-10 NOTE — ED Notes (Signed)
Pt sitting in dayroom interacting with peers. No acute distress noted. No concerns voiced. Informed pt to notify staff with any needs or assistance. Pt verbalized understanding or agreement. Will continue to monitor for safety. 

## 2023-01-10 NOTE — ED Provider Notes (Signed)
Behavioral Health Progress Note  Date and Time: 01/10/2023 3:34 PM Name: Ronnie Herman MRN:  [id number]  Subjective:  Ronnie Herman is a 37 year old male with a psychiatric history of bipolar 1 disorder and polysubstance use (crack cocaine, meth, and marijuana) who was admitted to [facility] 4/1 for detox and assistance with substance use treatment.   On assessment today, patient reports feeling tired with current medications. Although, he did get out of bed to participate in groups. He denies withdrawal symptoms at this time. He denies SI, HI, and AVH. He states that although he left the last two [place] where he lived, he is open to another [employer] in a different location if he is unable to get into treatment at [facility]. He is advised that [facility] did not accept his insurance. Patient denies further acute concerns or complaints today.   Diagnosis:  Final diagnoses:  Polysubstance abuse    Total Time spent with patient: 30 minutes  Past Psychiatric History: Patient has a several forted psychiatric history of bipolar 1 disorder and polysubstance abuse (methamphetamines, cocaine, and marijuana).  Past Medical History:  Past Medical History:  Diagnosis Date   Bipolar 1 disorder ([place])    Drug abuse ([place])     Family History: None reported Social History: "using meth, cocaine, and marijuana daily for the past 4 years. He has participated in residential substance abuse treatment in the past roughly 3-4 years ago. He is unsure of the amount that he is using but he is snorting cocaine and smoking daily. He is injecting methamphetamines daily."  Additional Social History:    Pain Medications: SEE MAR Prescriptions: SEE MAR Over the Counter: SEE MAR History of alcohol / drug use?: Yes Longest period of sobriety (when/how long): UNKNOWN Negative Consequences of Use: Personal relationships, Museum/gallery curator, Work / School Withdrawal Symptoms: None Name of Substance 1: METH 1 - Age of First  Use: 31 1 - Amount (size/oz): 1/2 GRAM 1 - Frequency: DAILY 1 - Duration: ONGOING 1 - Last Use / Amount: TWO DAYS AGO 1 - Method of Aquiring: UNKNOWN 1- Route of Use: SMOKE AND IV Name of Substance 2: COCAINE 2 - Age of First Use: 31 2 - Amount (size/oz): UNKNOWN 2 - Frequency: DAILY 2 - Duration: 2 WEEKS 2 - Last Use / Amount: TODAY 2 - Method of Aquiring: UNKNOWN 2 - Route of Substance Use: SMOKING Name of Substance 3: THC 3 - Age of First Use: 14 3 - Amount (size/oz): 2 BLUNTS 3 - Frequency: DAILY 3 - Duration: ONGOING 3 - Last Use / Amount: TODAY 3 - Method of Aquiring: UNKNOWN 3 - Route of Substance Use: SMOKING              Sleep: Fair  Appetite:  Good  Current Medications:  Current Facility-Administered Medications  Medication Dose Route Frequency Provider Last Rate Last Admin   acetaminophen (TYLENOL) tablet 650 mg  650 mg Oral Q6H PRN Revonda Humphrey, NP       alum & mag hydroxide-simeth (MAALOX/MYLANTA) 200-200-20 MG/5ML suspension 30 mL  30 mL Oral Q4H PRN Revonda Humphrey, NP       cloNIDine (CATAPRES) tablet 0.1 mg  0.1 mg Oral QID Rosezetta Schlatter, MD   0.1 mg at 01/10/23 1245   Followed by   Derrill Memo ON 01/11/2023] cloNIDine (CATAPRES) tablet 0.1 mg  0.1 mg Oral Melven Sartorius, MD       Followed by   Derrill Memo ON 01/13/2023] cloNIDine (CATAPRES) tablet  0.1 mg  0.1 mg Oral QAC breakfast Rosezetta Schlatter, MD       dicyclomine (BENTYL) tablet 20 mg  20 mg Oral Q6H PRN Rosezetta Schlatter, MD       hydrOXYzine (ATARAX) tablet 25 mg  25 mg Oral Q6H PRN Rosezetta Schlatter, MD   25 mg at 01/09/23 2150   hydrOXYzine (ATARAX) tablet 50 mg  50 mg Oral QHS PRN Rosezetta Schlatter, MD       loperamide (IMODIUM) capsule 2-4 mg  2-4 mg Oral PRN Rosezetta Schlatter, MD       magnesium hydroxide (MILK OF MAGNESIA) suspension 30 mL  30 mL Oral Daily PRN Revonda Humphrey, NP   30 mL at 01/09/23 1602   methocarbamol (ROBAXIN) tablet 500 mg  500 mg Oral Q8H PRN Rosezetta Schlatter, MD        naproxen (NAPROSYN) tablet 500 mg  500 mg Oral BID PRN Rosezetta Schlatter, MD       ondansetron (ZOFRAN-ODT) disintegrating tablet 4 mg  4 mg Oral Q6H PRN Rosezetta Schlatter, MD       No current outpatient medications on file.    Labs  Lab Results:  Admission on 01/07/2023  Component Date Value Ref Range Status   WBC 01/07/2023 6.2  4.0 - 10.5 K/uL Final   RBC 01/07/2023 4.48  4.22 - 5.81 MIL/uL Final   Hemoglobin 01/07/2023 13.5  13.0 - 17.0 g/dL Final   HCT 01/07/2023 43.4  39.0 - 52.0 % Final   MCV 01/07/2023 96.9  80.0 - 100.0 fL Final   MCH 01/07/2023 30.1  26.0 - 34.0 pg Final   MCHC 01/07/2023 31.1  30.0 - 36.0 g/dL Final   RDW 01/07/2023 13.2  11.5 - 15.5 % Final   Platelets 01/07/2023 289  150 - 400 K/uL Final   nRBC 01/07/2023 0.0  0.0 - 0.2 % Final   Neutrophils Relative % 01/07/2023 65  % Final   Neutro Abs 01/07/2023 4.1  1.7 - 7.7 K/uL Final   Lymphocytes Relative 01/07/2023 25  % Final   Lymphs Abs 01/07/2023 1.5  0.7 - 4.0 K/uL Final   Monocytes Relative 01/07/2023 7  % Final   Monocytes Absolute 01/07/2023 0.4  0.1 - 1.0 K/uL Final   Eosinophils Relative 01/07/2023 2  % Final   Eosinophils Absolute 01/07/2023 0.1  0.0 - 0.5 K/uL Final   Basophils Relative 01/07/2023 1  % Final   Basophils Absolute 01/07/2023 0.0  0.0 - 0.1 K/uL Final   Immature Granulocytes 01/07/2023 0  % Final   Abs Immature Granulocytes 01/07/2023 0.01  0.00 - 0.07 K/uL Final   Performed at [facility], [place] [address], [place], [place] [address]   Sodium 01/07/2023 138  135 - 145 mmol/L Final   Potassium 01/07/2023 4.1  3.5 - 5.1 mmol/L Final   Chloride 01/07/2023 100  98 - 111 mmol/L Final   CO2 01/07/2023 28  22 - 32 mmol/L Final   Glucose, Bld 01/07/2023 121 (H)  70 - 99 mg/dL Final   Glucose reference range applies only to samples taken after fasting for at least 8 hours.   BUN 01/07/2023 12  6 - 20 mg/dL Final   Creatinine, Ser 01/07/2023 0.86  0.61 - 1.24 mg/dL Final   Calcium  01/07/2023 9.3  8.9 - 10.3 mg/dL Final   Total Protein 01/07/2023 6.3 (L)  6.5 - 8.1 g/dL Final   Albumin 01/07/2023 3.8  3.5 - 5.0 g/dL Final  AST 01/07/2023 19  15 - 41 U/L Final   ALT 01/07/2023 17  0 - 44 U/L Final   Alkaline Phosphatase 01/07/2023 76  38 - 126 U/L Final   Total Bilirubin 01/07/2023 0.6  0.3 - 1.2 mg/dL Final   GFR, Estimated 01/07/2023 >60  >60 mL/min Final   Comment: (NOTE) Calculated using the CKD-EPI Creatinine Equation (2021)    Anion gap 01/07/2023 10  5 - 15 Final   Performed at [facility], [place] [address], [place], [place] [address]   Hgb A1c MFr Bld 01/07/2023 4.9  4.8 - 5.6 % Final   Comment: (NOTE)         Prediabetes: 5.7 - 6.4         Diabetes: >6.4         Glycemic control for adults with diabetes: <7.0    Mean Plasma Glucose 01/07/2023 94  mg/dL Final   Comment: (NOTE) Performed At: [facility] [place], [place] [id number] Rush Farmer MD [id number]    Magnesium 01/07/2023 2.2  1.7 - 2.4 mg/dL Final   Performed at [facility], [place] [address], [place], [place] [address]   Alcohol, Ethyl (B) 01/07/2023 <10  <10 mg/dL Final   Comment: (NOTE) Lowest detectable limit for serum alcohol is 10 mg/dL.  For medical purposes only. Performed at [facility], [place] [address], [place], [place] [address]    Cholesterol 01/07/2023 184  0 - 200 mg/dL Final   Triglycerides 01/07/2023 151 (H)  <150 mg/dL Final   HDL 01/07/2023 56  >40 mg/dL Final   Total CHOL/HDL Ratio 01/07/2023 3.3  RATIO Final   VLDL 01/07/2023 30  0 - 40 mg/dL Final   LDL Cholesterol 01/07/2023 98  0 - 99 mg/dL Final   Comment:        Total Cholesterol/HDL:CHD Risk Coronary Heart Disease Risk Table                     Men   Women  1/2 Average Risk   3.4   3.3  Average Risk       5.0   4.4  2 X Average Risk   9.6   7.1  3 X Average Risk  23.4   11.0        Use the calculated Patient Ratio above and the CHD Risk Table to determine  the patient's CHD Risk.        ATP III CLASSIFICATION (LDL):  <100     mg/dL   Optimal  100-129  mg/dL   Near or Above                    Optimal  130-159  mg/dL   Borderline  160-189  mg/dL   High  >190     mg/dL   Very High Performed at [place] [address], [place], [place] [address]    TSH 01/07/2023 0.336 (L)  0.350 - 4.500 uIU/mL Final   Comment: Performed by a 3rd Generation assay with a functional sensitivity of <=0.01 uIU/mL. Performed at [facility], [place] [address], [place],  [address]    Chlamydia 01/07/2023 Negative   Final   Neisseria Gonorrhea 01/07/2023 Negative   Final   Comment 01/07/2023 Normal Reference [place] Chlamydia - Negative   Final   Comment 01/07/2023 Normal Reference Range Neisseria Gonorrhea - Negative   Final   RPR Ser  Ql 01/07/2023 NON REACTIVE  NON REACTIVE Final   Performed at [facility], [place] [address] [place], [place] [address]   POC Amphetamine UR 01/07/2023 None Detected  NONE DETECTED (Cut Off Level 1000 ng/mL) Final   POC Secobarbital (BAR) 01/07/2023 None Detected  NONE DETECTED (Cut Off Level 300 ng/mL) Final   POC Buprenorphine (BUP) 01/07/2023 None Detected  NONE DETECTED (Cut Off Level 10 ng/mL) Final   POC Oxazepam (BZO) 01/07/2023 None Detected  NONE DETECTED (Cut Off Level 300 ng/mL) Final   POC Cocaine UR 01/07/2023 Positive (A)  NONE DETECTED (Cut Off Level 300 ng/mL) Final   POC Methamphetamine UR 01/07/2023 Positive (A)  NONE DETECTED (Cut Off Level 1000 ng/mL) Final   POC Morphine 01/07/2023 None Detected  NONE DETECTED (Cut Off Level 300 ng/mL) Final   POC Methadone UR 01/07/2023 None Detected  NONE DETECTED (Cut Off Level 300 ng/mL) Final   POC Oxycodone UR 01/07/2023 None Detected  NONE DETECTED (Cut Off Level 100 ng/mL) Final   POC Marijuana UR 01/07/2023 Positive (A)  NONE DETECTED (Cut Off Level 50 ng/mL) Final   Color, Urine 01/07/2023 YELLOW  YELLOW Final   APPearance 01/07/2023 CLEAR   CLEAR Final   Specific Gravity, Urine 01/07/2023 1.026  1.005 - 1.030 Final   pH 01/07/2023 5.0  5.0 - 8.0 Final   Glucose, UA 01/07/2023 NEGATIVE  NEGATIVE mg/dL Final   Hgb urine dipstick 01/07/2023 NEGATIVE  NEGATIVE Final   Bilirubin Urine 01/07/2023 NEGATIVE  NEGATIVE Final   Ketones, ur 01/07/2023 5 (A)  NEGATIVE mg/dL Final   Protein, ur 01/07/2023 NEGATIVE  NEGATIVE mg/dL Final   Nitrite 01/07/2023 NEGATIVE  NEGATIVE Final   Leukocytes,Ua 01/07/2023 NEGATIVE  NEGATIVE Final   Performed at [facility], [place] [address], [place], [place] [address]   HIV Screen 4th Generation wRfx 01/07/2023 Non Reactive  Non Reactive Final   Performed at [facility], [place] [address], [place], [place] [address]   SARSCOV2ONAVIRUS 2 AG 01/07/2023 NEGATIVE  NEGATIVE Final   Comment: (NOTE) SARS-CoV-2 antigen NOT DETECTED.   Negative results are presumptive.  Negative results do not preclude SARS-CoV-2 infection and should not be used as the sole basis for treatment or other patient management decisions, including infection  control decisions, particularly in the presence of clinical signs and  symptoms consistent with COVID-19, or in those who have been in contact with the virus.  Negative results must be combined with clinical observations, patient history, and epidemiological information. The expected result is Negative.  Fact Sheet for Patients: HandmadeRecipes.com.cy  Fact Sheet for Healthcare Providers: FuneralLife.at  This test is not yet approved or cleared by the [place] FDA and  has been authorized for detection and/or diagnosis of SARS-CoV-2 by FDA under an Emergency Use Authorization (EUA).  This EUA will remain in effect (meaning this test can be used) for the duration of  the COV                          ID-19 declaration under Section 564(b)(1) of the Act, 21 U.S.C. section 360bbb-3(b)(1), unless the authorization  is terminated or revoked sooner.     SARS Coronavirus 2 by RT PCR 01/07/2023 NEGATIVE  NEGATIVE Final   Performed at [facility], [place] [address] [place],  [address]  Admission on 11/14/2022, Discharged on 11/16/2022  Component Date Value Ref Range Status   Sodium 11/14/2022 134 (L)  135 - 145 mmol/L Final   Potassium 11/14/2022 3.5  3.5 - 5.1 mmol/L Final   Chloride 11/14/2022 95 (L)  98 - 111 mmol/L Final   CO2 11/14/2022 27  22 - 32 mmol/L Final   Glucose, Bld 11/14/2022 104 (H)  70 - 99 mg/dL Final   Glucose reference range applies only to samples taken after fasting for at least 8 hours.   BUN 11/14/2022 15  6 - 20 mg/dL Final   Creatinine, Ser 11/14/2022 1.19  0.61 - 1.24 mg/dL Final   Calcium 11/14/2022 9.0  8.9 - 10.3 mg/dL Final   GFR, Estimated 11/14/2022 >60  >60 mL/min Final   Comment: (NOTE) Calculated using the CKD-EPI Creatinine Equation (2021)    Anion gap 11/14/2022 12  5 - 15 Final   Performed at [facility], [place] [address], [place], [place] [address]   WBC 11/14/2022 25.4 (H)  4.0 - 10.5 K/uL Final   RBC 11/14/2022 4.30  4.22 - 5.81 MIL/uL Final   Hemoglobin 11/14/2022 13.4  13.0 - 17.0 g/dL Final   HCT 11/14/2022 41.8  39.0 - 52.0 % Final   MCV 11/14/2022 97.2  80.0 - 100.0 fL Final   MCH 11/14/2022 31.2  26.0 - 34.0 pg Final   MCHC 11/14/2022 32.1  30.0 - 36.0 g/dL Final   RDW 11/14/2022 13.3  11.5 - 15.5 % Final   Platelets 11/14/2022 325  150 - 400 K/uL Final   nRBC 11/14/2022 0.0  0.0 - 0.2 % Final   Performed at [facility], [place] [address], [place], [place] [address]   Troponin I (High Sensitivity) 11/14/2022 2  <18 ng/L Final   Comment: (NOTE) Elevated high sensitivity troponin I (hsTnI) values and significant  changes across serial measurements may suggest ACS but many other  chronic and acute conditions are known to elevate hsTnI results.  Refer to the "Links" section for chest pain algorithms and  additional  guidance. Performed at [facility], [place] [address], [place], [place] [address]    SARS Coronavirus 2 by RT PCR 11/14/2022 NEGATIVE  NEGATIVE Final   Comment: (NOTE) SARS-CoV-2 target nucleic acids are NOT DETECTED.  The SARS-CoV-2 RNA is generally detectable in upper respiratory specimens during the acute phase of infection. The lowest concentration of SARS-CoV-2 viral copies this assay can detect is 138 copies/mL. A negative result does not preclude SARS-Cov-2 infection and should not be used as the sole basis for treatment or other patient management decisions. A negative result may occur with  improper specimen collection/handling, submission of specimen other than nasopharyngeal swab, presence of viral mutation(s) within the areas targeted by this assay, and inadequate number of viral copies(<138 copies/mL). A negative result must be combined with clinical observations, patient history, and epidemiological information. The expected result is Negative.  Fact Sheet for Patients:  EntrepreneurPulse.com.au  Fact Sheet for Healthcare Providers:  IncredibleEmployment.be  This test is no                          t yet approved or cleared by the [place] FDA and  has been authorized for detection and/or diagnosis of SARS-CoV-2 by FDA under an Emergency Use Authorization (EUA). This EUA will remain  in effect (meaning this test can be used) for the duration of the COVID-19 declaration under Section 564(b)(1) of the Act, 21 U.S.C.section 360bbb-3(b)(1), unless the authorization is terminated  or revoked sooner.  Influenza A by PCR 11/14/2022 NEGATIVE  NEGATIVE Final   Influenza B by PCR 11/14/2022 NEGATIVE  NEGATIVE Final   Comment: (NOTE) The Xpert Xpress SARS-CoV-2/FLU/RSV plus assay is intended as an aid in the diagnosis of influenza from Nasopharyngeal swab specimens and should not be used as a sole  basis for treatment. Nasal washings and aspirates are unacceptable for Xpert Xpress SARS-CoV-2/FLU/RSV testing.  Fact Sheet for Patients: EntrepreneurPulse.com.au  Fact Sheet for Healthcare Providers: IncredibleEmployment.be  This test is not yet approved or cleared by the [place] FDA and has been authorized for detection and/or diagnosis of SARS-CoV-2 by FDA under an Emergency Use Authorization (EUA). This EUA will remain in effect (meaning this test can be used) for the duration of the COVID-19 declaration under Section 564(b)(1) of the Act, 21 U.S.C. section 360bbb-3(b)(1), unless the authorization is terminated or revoked.     Resp Syncytial Virus by PCR 11/14/2022 NEGATIVE  NEGATIVE Final   Comment: (NOTE) Fact Sheet for Patients: EntrepreneurPulse.com.au  Fact Sheet for Healthcare Providers: IncredibleEmployment.be  This test is not yet approved or cleared by the [place] FDA and has been authorized for detection and/or diagnosis of SARS-CoV-2 by FDA under an Emergency Use Authorization (EUA). This EUA will remain in effect (meaning this test can be used) for the duration of the COVID-19 declaration under Section 564(b)(1) of the Act, 21 U.S.C. section 360bbb-3(b)(1), unless the authorization is terminated or revoked.  Performed at [facility], [place] [address] [place], [place] [address]    D-Dimer, Quant 11/14/2022 1.78 (H)  0.00 - 0.50 ug/mL-FEU Final   Comment: (NOTE) At the manufacturer cut-off value of 0.5 g/mL FEU, this assay has a negative predictive value of 95-100%.This assay is intended for use in conjunction with a clinical pretest probability (PTP) assessment model to exclude pulmonary embolism (PE) and deep venous thrombosis (DVT) in outpatients suspected of PE or DVT. Results should be correlated with clinical presentation. Performed at [facility], [place] [address], [place], [place] [address]    Opiates 11/14/2022 NONE DETECTED  NONE DETECTED Final   Cocaine 11/14/2022 NONE DETECTED  NONE DETECTED Final   Benzodiazepines 11/14/2022 NONE DETECTED  NONE DETECTED Final   Amphetamines 11/14/2022 POSITIVE (A)  NONE DETECTED Final   Tetrahydrocannabinol 11/14/2022 POSITIVE (A)  NONE DETECTED Final   Barbiturates 11/14/2022 NONE DETECTED  NONE DETECTED Final   Comment: (NOTE) DRUG SCREEN FOR MEDICAL PURPOSES ONLY.  IF CONFIRMATION IS NEEDED FOR ANY PURPOSE, NOTIFY LAB WITHIN 5 DAYS.  LOWEST DETECTABLE LIMITS FOR URINE DRUG SCREEN Drug Class                     Cutoff (ng/mL) Amphetamine and metabolites    1000 Barbiturate and metabolites    200 Benzodiazepine                 200 Opiates and metabolites        300 Cocaine and metabolites        300 THC                            50 Performed at [facility], [place] [address], [place], [place] [address]    Troponin I (High Sensitivity) 11/14/2022 3  <18 ng/L Final   Comment: (NOTE) Elevated high sensitivity troponin I (hsTnI) values and significant  changes across serial measurements may suggest ACS but many other  chronic  and acute conditions are known to elevate hsTnI results.  Refer to the "Links" section for chest pain algorithms and additional  guidance. Performed at [facility], [place] [address], [place], [place] [address]    HIV Screen 4th Generation wRfx 11/14/2022 Non Reactive  Non Reactive Final   Performed at [facility], [place] [address], [place], [place] [address]   Strep Pneumo Urinary Antigen 11/14/2022 NEGATIVE  NEGATIVE Final   Comment:        Infection due to S. pneumoniae cannot be absolutely ruled out since the antigen present may be below the detection limit of the test. Performed at [facility], [address] [place], [place] [address]    L. pneumophila Serogp 1 Ur Ag 11/14/2022  Negative  Negative Final   Comment: (NOTE) Presumptive negative for L. pneumophila serogroup 1 antigen in urine, suggesting no recent or current infection. Legionnaires' disease cannot be ruled out since other serogroups and species may also cause disease. Performed At: [facility] [place], [place] [id number] Rush Farmer MD [id number]    Source of Sample 11/14/2022 URINE, CLEAN CATCH   Final   Performed at [facility], [place] [address], [place], [place] [address]   Sodium 11/15/2022 133 (L)  135 - 145 mmol/L Final   Potassium 11/15/2022 3.3 (L)  3.5 - 5.1 mmol/L Final   Chloride 11/15/2022 99  98 - 111 mmol/L Final   CO2 11/15/2022 22  22 - 32 mmol/L Final   Glucose, Bld 11/15/2022 116 (H)  70 - 99 mg/dL Final   Glucose reference range applies only to samples taken after fasting for at least 8 hours.   BUN 11/15/2022 10  6 - 20 mg/dL Final   Creatinine, Ser 11/15/2022 0.73  0.61 - 1.24 mg/dL Final   Calcium 11/15/2022 7.9 (L)  8.9 - 10.3 mg/dL Final   Total Protein 11/15/2022 5.9 (L)  6.5 - 8.1 g/dL Final   Albumin 11/15/2022 2.9 (L)  3.5 - 5.0 g/dL Final   AST 11/15/2022 22  15 - 41 U/L Final   ALT 11/15/2022 22  0 - 44 U/L Final   Alkaline Phosphatase 11/15/2022 68  38 - 126 U/L Final   Total Bilirubin 11/15/2022 1.7 (H)  0.3 - 1.2 mg/dL Final   GFR, Estimated 11/15/2022 >60  >60 mL/min Final   Comment: (NOTE) Calculated using the CKD-EPI Creatinine Equation (2021)    Anion gap 11/15/2022 12  5 - 15 Final   Performed at [facility], [place] [address] [place], [place] [address]   WBC 11/15/2022 25.4 (H)  4.0 - 10.5 K/uL Final   RBC 11/15/2022 3.27 (L)  4.22 - 5.81 MIL/uL Final   Hemoglobin 11/15/2022 10.2 (L)  13.0 - 17.0 g/dL Final   HCT 11/15/2022 32.0 (L)  39.0 - 52.0 % Final   MCV 11/15/2022 97.9  80.0 - 100.0 fL Final   MCH 11/15/2022 31.2  26.0 - 34.0 pg Final   MCHC 11/15/2022 31.9  30.0 - 36.0 g/dL Final    RDW 11/15/2022 13.5  11.5 - 15.5 % Final   Platelets 11/15/2022 245  150 - 400 K/uL Final   nRBC 11/15/2022 0.0  0.0 - 0.2 % Final   Neutrophils Relative % 11/15/2022 87  % Final   Neutro Abs 11/15/2022 21.9 (H)  1.7 - 7.7 K/uL Final   Lymphocytes Relative 11/15/2022 4  % Final   Lymphs Abs 11/15/2022 1.0  0.7 - 4.0 K/uL Final   Monocytes Relative 11/15/2022 6  % Final   Monocytes Absolute 11/15/2022 1.6 (H)  0.1 - 1.0 K/uL Final   Eosinophils Relative 11/15/2022 0  % Final   Eosinophils Absolute 11/15/2022 0.0  0.0 - 0.5 K/uL Final   Basophils Relative 11/15/2022 0  % Final   Basophils Absolute 11/15/2022 0.1  0.0 - 0.1 K/uL Final   Immature Granulocytes 11/15/2022 3  % Final   Abs Immature Granulocytes 11/15/2022 0.75 (H)  0.00 - 0.07 K/uL Final   Performed at [facility], [place] [address], [place], [place] [address]   Phosphorus 11/15/2022 3.3  2.5 - 4.6 mg/dL Final   Performed at [facility], [place] [address] [place], [place] [address]   Magnesium 11/15/2022 1.8  1.7 - 2.4 mg/dL Final   Performed at [place] [address] [place], [place] [address]   Specimen Description 11/14/2022    Final                   Value:BLOOD RIGHT ANTECUBITAL Performed at [place] [address] [place], [place] [address]    Special Requests 11/14/2022    Final                   Value:BOTTLES DRAWN AEROBIC AND ANAEROBIC Blood Culture adequate volume Performed at [place] [address], [place], [place] [address]    Culture 11/14/2022    Final                   Value:NO GROWTH 5 DAYS Performed at [place] [address] [place], [place] [address]    Report Status 11/14/2022 11/19/2022 FINAL   Final   Specimen Description 11/14/2022    Final                   Value:BLOOD BLOOD RIGHT HAND Performed at [facility], [place] [address], [place], [place]  [address]    Special Requests 11/14/2022    Final                   Value:BOTTLES DRAWN AEROBIC AND ANAEROBIC Blood Culture adequate volume Performed at [place] [address] [place], [place] [address]    Culture 11/14/2022    Final                   Value:NO GROWTH 5 DAYS Performed at [place] [address], [place], [place] [address]    Report Status 11/14/2022 11/20/2022 FINAL   Final   Lactic Acid, Venous 11/15/2022 1.1  0.5 - 1.9 mmol/L Final   Performed at [place] [address], [place], [place] [address]   Lactic Acid, Venous 11/15/2022 3.0 (HH)  0.5 - 1.9 mmol/L Final   Comment: CRITICAL RESULT CALLED TO, READ BACK BY AND VERIFIED WITH DARK,A. RN AT 1132 11/15/22 MULLINS,T Performed at [facility], [place] [address], [place], [place] [address]    MRSA by PCR Next Gen 11/15/2022 NOT DETECTED  NOT DETECTED Final   Comment: (NOTE) The GeneXpert MRSA Assay (FDA approved for NASAL specimens only), is one component of a comprehensive MRSA colonization surveillance program. It is not intended to diagnose MRSA infection nor to guide or monitor treatment for MRSA infections. Test performance is not FDA approved in patients less than 75 years old.  Performed at [facility], [place] [address] [place], [place] [address]    Lactic Acid, Venous 11/15/2022 1.3  0.5 - 1.9 mmol/L Final   Performed at [place] [address], [place], [place] [address]   WBC 11/16/2022 13.9 (H)  4.0 - 10.5 K/uL Final   RBC 11/16/2022 3.12 (L)  4.22 - 5.81 MIL/uL Final   Hemoglobin 11/16/2022 9.7 (L)  13.0 - 17.0 g/dL Final   HCT 11/16/2022 30.5 (L)  39.0 - 52.0 % Final   MCV 11/16/2022 97.8  80.0 - 100.0 fL Final   MCH 11/16/2022 31.1  26.0 - 34.0 pg Final   MCHC 11/16/2022 31.8  30.0 - 36.0 g/dL Final   RDW 11/16/2022 13.5  11.5 - 15.5 % Final   Platelets 11/16/2022 243  150 - 400 K/uL  Final   nRBC 11/16/2022 0.0  0.0 - 0.2 % Final   Neutrophils Relative % 11/16/2022 76  % Final   Neutro Abs 11/16/2022 10.6 (H)  1.7 - 7.7 K/uL Final   Lymphocytes Relative 11/16/2022 12  % Final   Lymphs Abs 11/16/2022 1.6  0.7 - 4.0 K/uL Final   Monocytes Relative 11/16/2022 10  % Final   Monocytes Absolute 11/16/2022 1.4 (H)  0.1 - 1.0 K/uL Final   Eosinophils Relative 11/16/2022 1  % Final   Eosinophils Absolute 11/16/2022 0.1  0.0 - 0.5 K/uL Final   Basophils Relative 11/16/2022 0  % Final   Basophils Absolute 11/16/2022 0.0  0.0 - 0.1 K/uL Final   Immature Granulocytes 11/16/2022 1  % Final   Abs Immature Granulocytes 11/16/2022 0.11 (H)  0.00 - 0.07 K/uL Final   Performed at [facility], [place] [address] [place], [place] [address]   Sodium 11/16/2022 135  135 - 145 mmol/L Final   Potassium 11/16/2022 3.8  3.5 - 5.1 mmol/L Final   Chloride 11/16/2022 103  98 - 111 mmol/L Final   CO2 11/16/2022 23  22 - 32 mmol/L Final   Glucose, Bld 11/16/2022 88  70 - 99 mg/dL Final   Glucose reference range applies only to samples taken after fasting for at least 8 hours.   BUN 11/16/2022 8  6 - 20 mg/dL Final   Creatinine, Ser 11/16/2022 0.75  0.61 - 1.24 mg/dL Final   Calcium 11/16/2022 7.8 (L)  8.9 - 10.3 mg/dL Final   Total Protein 11/16/2022 6.0 (L)  6.5 - 8.1 g/dL Final   Albumin 11/16/2022 2.5 (L)  3.5 - 5.0 g/dL Final   AST 11/16/2022 13 (L)  15 - 41 U/L Final   ALT 11/16/2022 15  0 - 44 U/L Final   Alkaline Phosphatase 11/16/2022 57  38 - 126 U/L Final   Total Bilirubin 11/16/2022 0.7  0.3 - 1.2 mg/dL Final   GFR, Estimated 11/16/2022 >60  >60 mL/min Final   Comment: (NOTE) Calculated using the CKD-EPI Creatinine Equation (2021)    Anion gap 11/16/2022 9  5 - 15 Final   Performed at [facility], [place] [address], [place], [place] [address]   Magnesium 11/16/2022 2.0  1.7 - 2.4 mg/dL Final   Performed at [place]  [address], [place], [place] [address]   Procalcitonin 11/16/2022 4.93  ng/mL Final   Comment:        Interpretation: PCT > 2 ng/mL: Systemic infection (sepsis) is likely, unless other causes are known. (NOTE)       Sepsis PCT  Algorithm           Lower Respiratory Tract                                      Infection PCT Algorithm    ----------------------------     ----------------------------         PCT < 0.25 ng/mL                PCT < 0.10 ng/mL          Strongly encourage             Strongly discourage   discontinuation of antibiotics    initiation of antibiotics    ----------------------------     -----------------------------       PCT 0.25 - 0.50 ng/mL            PCT 0.10 - 0.25 ng/mL               OR       >80% decrease in PCT            Discourage initiation of                                            antibiotics      Encourage discontinuation           of antibiotics    ----------------------------     -----------------------------         PCT >= 0.50 ng/mL              PCT 0.26 - 0.50 ng/mL               AND       <80% decrease in PCT                                       Encourage initiation of                                             antibiotics       Encourage continuation           of antibiotics    ----------------------------     -----------------------------        PCT >= 0.50 ng/mL                  PCT > 0.50 ng/mL               AND         increase in PCT                  Strongly encourage                                      initiation of antibiotics    Strongly encourage escalation           of antibiotics                                     -----------------------------  PCT <= 0.25 ng/mL                                                 OR                                        > 80% decrease in PCT                                      Discontinue / Do not initiate                                              antibiotics  Performed at [place] [place] [address] [place],  [address]   Admission on 10/29/2022, Discharged on 10/30/2022  Component Date Value Ref Range Status   SARS Coronavirus 2 by RT PCR 10/29/2022 NEGATIVE  NEGATIVE Final   Comment: (NOTE) SARS-CoV-2 target nucleic acids are NOT DETECTED.  The SARS-CoV-2 RNA is generally detectable in upper respiratory specimens during the acute phase of infection. The lowest concentration of SARS-CoV-2 viral copies this assay can detect is 138 copies/mL. A negative result does not preclude SARS-Cov-2 infection and should not be used as the sole basis for treatment or other patient management decisions. A negative result may occur with  improper specimen collection/handling, submission of specimen other than nasopharyngeal swab, presence of viral mutation(s) within the areas targeted by this assay, and inadequate number of viral copies(<138 copies/mL). A negative result must be combined with clinical observations, patient history, and epidemiological information. The expected result is Negative.  Fact Sheet for Patients:  EntrepreneurPulse.com.au  Fact Sheet for Healthcare Providers:  IncredibleEmployment.be  This test is no                          t yet approved or cleared by the [place] FDA and  has been authorized for detection and/or diagnosis of SARS-CoV-2 by FDA under an Emergency Use Authorization (EUA). This EUA will remain  in effect (meaning this test can be used) for the duration of the COVID-19 declaration under Section 564(b)(1) of the Act, 21 U.S.C.section 360bbb-3(b)(1), unless the authorization is terminated  or revoked sooner.       Influenza A by PCR 10/29/2022 NEGATIVE  NEGATIVE Final   Influenza B by PCR 10/29/2022 NEGATIVE  NEGATIVE Final   Comment: (NOTE) The Xpert Xpress SARS-CoV-2/FLU/RSV plus assay is intended as an aid in the  diagnosis of influenza from Nasopharyngeal swab specimens and should not be used as a sole basis for treatment. Nasal washings and aspirates are unacceptable for Xpert Xpress SARS-CoV-2/FLU/RSV testing.  Fact Sheet for Patients: EntrepreneurPulse.com.au  Fact Sheet for Healthcare Providers: IncredibleEmployment.be  This test is not yet approved or cleared by the [place] FDA and has been authorized for detection and/or diagnosis of SARS-CoV-2 by FDA under an Emergency Use Authorization (EUA). This EUA will remain in effect (meaning this test can be used) for the duration of the COVID-19 declaration under Section 564(b)(1) of  the Act, 21 U.S.C. section 360bbb-3(b)(1), unless the authorization is terminated or revoked.     Resp Syncytial Virus by PCR 10/29/2022 NEGATIVE  NEGATIVE Final   Comment: (NOTE) Fact Sheet for Patients: EntrepreneurPulse.com.au  Fact Sheet for Healthcare Providers: IncredibleEmployment.be  This test is not yet approved or cleared by the [place] FDA and has been authorized for detection and/or diagnosis of SARS-CoV-2 by FDA under an Emergency Use Authorization (EUA). This EUA will remain in effect (meaning this test can be used) for the duration of the COVID-19 declaration under Section 564(b)(1) of the Act, 21 U.S.C. section 360bbb-3(b)(1), unless the authorization is terminated or revoked.  Performed at [facility], [place] [address] [place], [place] [address]    Group A Strep by PCR 10/29/2022 NOT DETECTED  NOT DETECTED Final   Performed at [place] [address] [place], [place] [address]    Blood Alcohol level:  Lab Results  Component Value Date   ETH <10 [id number]    Metabolic Disorder Labs: Lab Results  Component Value Date   HGBA1C 4.9 01/07/2023   MPG 94 01/07/2023   No results found for: "PROLACTIN" Lab  Results  Component Value Date   CHOL 184 01/07/2023   TRIG 151 (H) 01/07/2023   HDL 56 01/07/2023   CHOLHDL 3.3 01/07/2023   VLDL 30 01/07/2023   LDLCALC 98 01/07/2023    Therapeutic Lab Levels: No results found for: "LITHIUM" No results found for: "VALPROATE" No results found for: "CBMZ"  Physical Findings   PHQ2-9    [place] ED from 01/07/2023 in [facility]  PHQ-2 Total Score 2  PHQ-9 Total Score 11      [place] ED from 01/07/2023 in [facility] ED to Hosp-Admission (Discharged) from 11/14/2022 in [place] ED from 10/29/2022 in [facility] Emergency Department at [place] No Risk No Risk        Musculoskeletal  Strength & Muscle Tone: within normal limits Gait & Station: normal Patient leans: N/A  Psychiatric Specialty Exam  Presentation  General Appearance:  Appropriate for Environment; Casual  Eye Contact: Fair  Speech: Clear and Coherent; Normal Rate  Speech Volume: Normal  Handedness: Right   Mood and Affect  Mood: Depressed  Affect: Congruent; Depressed; Tearful   Thought Process  Thought Processes: Coherent; Goal Directed; Linear  Descriptions of Associations:Intact  Orientation:Full (Time, Place and Person)  Thought Content:WDL; Logical  Diagnosis of Schizophrenia or Schizoaffective disorder in past: No    Hallucinations:Hallucinations: None   Ideas of Reference:None  Suicidal Thoughts:Suicidal Thoughts: Yes, Passive SI Passive Intent and/or Plan: Without Intent; Without Plan   Homicidal Thoughts:Homicidal Thoughts: No    Sensorium  Memory: Immediate Good; Recent Good  Judgment: Fair  Insight: Fair   Executive Functions  Concentration: Good  Attention Span: Good  Recall: Good  Fund of Knowledge: Good  Language: Good   Psychomotor Activity   Psychomotor Activity: Psychomotor Activity: Normal    Assets  Assets: Communication Skills; Desire for Improvement; Physical Health; Social Support; Vocational/Educational; Resilience   Sleep  Sleep: Sleep: Fair    Physical Exam  Physical Exam Vitals reviewed.  Constitutional:      General: He is not in acute distress. HENT:     Head: Normocephalic and atraumatic.  Pulmonary:     Effort: Pulmonary effort is normal.  Neurological:  Mental Status: He is alert.    Review of Systems  Constitutional:  Negative for diaphoresis and weight loss.  Gastrointestinal:  Negative for abdominal pain, diarrhea, nausea and vomiting.   Blood pressure 102/73, pulse 70, temperature 97.8 F (36.6 C), temperature source Oral, resp. rate 20, weight 185 lb (83.9 kg), SpO2 100 %. Body mass index is 25.09 kg/m.  Treatment Plan Summary: Daily contact with patient to assess and evaluate symptoms and progress in treatment and Medication management  Stimulant use disorder Opioid use disorder - Continue COWS monitoring with Clonidine moved to PRN, as BP in 100s/70s - Other PRNs -Maalox 30 ml p.o. every 4 hours as needed indigestion -MOM 30 ml p.o. daily as needed constipation -Melatonin 5 mg p.o. nightly as needed sleep -Bentyl 20 mg p.o. every 6 hours as needed spasms, abdominal cramps -Atarax 25 mg p.o. 3 times daily as needed anxiety -Imodium 2- 4 mg p.o. as needed diarrhea or loose stools -Robaxin 500 mg p.o. every 8 hours as needed muscle spasms -Naproxen 500 mg p.o. twice daily as needed aches and pains -Zofran ODT 4 mg p.o. every 6 hours as needed nausea, vomiting   Dispo: Pending  Rosezetta Schlatter, MD 01/10/2023 3:34 PM

## 2023-01-10 NOTE — Group Note (Signed)
Group Topic: Relaxation  Group Date: 01/10/2023 Start Time: 1100 End Time: 1145 Facilitators: Quentin Cornwall B  Department: [facility]  Number of Participants: 7  Group Focus: relaxation Treatment Modality:  Psychoeducation Interventions utilized were reality testing Purpose: express feelings  Name: Mahlon Vienneau Date of Birth: Jul 03, 1986  MR: [id number]    Level of Participation: active Quality of Participation: attentive and cooperative Interactions with others: gave feedback Mood/Affect: positive Triggers (if applicable): na Cognition: coherent/clear Progress: Moderate Response: na Plan: follow-up needed  Patients Problems:  Patient Active Problem List   Diagnosis Date Noted   Sepsis due to pneumonia 11/15/2022   Hypokalemia 11/15/2022   Dental infection 11/15/2022   Lactic acidosis 11/15/2022   Pneumonia of both lower lobes due to infectious organism 11/14/2022   Polysubstance abuse 11/14/2022   Bipolar 1 disorder 11/14/2022

## 2023-01-11 DIAGNOSIS — F1910 Other psychoactive substance abuse, uncomplicated: Secondary | ICD-10-CM | POA: Diagnosis not present

## 2023-01-11 DIAGNOSIS — F319 Bipolar disorder, unspecified: Secondary | ICD-10-CM | POA: Diagnosis not present

## 2023-01-11 DIAGNOSIS — R45851 Suicidal ideations: Secondary | ICD-10-CM | POA: Diagnosis not present

## 2023-01-11 DIAGNOSIS — Z1152 Encounter for screening for COVID-19: Secondary | ICD-10-CM | POA: Diagnosis not present

## 2023-01-11 MED ORDER — QUETIAPINE FUMARATE 50 MG PO TABS
50.0000 mg | ORAL_TABLET | Freq: Every day | ORAL | Status: DC
  Administered 2023-01-11 – 2023-01-13 (×3): 50 mg via ORAL
  Filled 2023-01-11: qty 1
  Filled 2023-01-11: qty 14
  Filled 2023-01-11 (×2): qty 1

## 2023-01-11 NOTE — ED Notes (Signed)
Patient is in room in no sxs of distress - will continue to monitor for safety  

## 2023-01-11 NOTE — Discharge Planning (Signed)
Referral was received at [facility] and per [facility], patient has been accepted and can transfer to the facility on Monday 04/08 by 9:00am. Update has been provided to the patient and MD made aware. Patient will need a 14-30 day supply of medication and one month refill. No nicotine gum allowed, however 14-30 day nicotine patches to be provided if needed. No other needs to report at this time.    LCSW will continue to follow up and provide updates as received.    Fernande Boyden, LCSW Clinical Social Worker [facility] Ph: [phone]

## 2023-01-11 NOTE — ED Notes (Signed)
Pt observed/assessed in room sleeping. RR even and unlabored, appearing in no noted distress. Environmental check complete, will continue to monitor for safety 

## 2023-01-11 NOTE — ED Notes (Signed)
Patient is sleeping. Respirations equal and unlabored, skin warm and dry, NAD. No change in assessment or acuity. Routine safety checks conducted according to facility protocol. Will continue to monitor for safety.   

## 2023-01-11 NOTE — ED Notes (Addendum)
When doing rounds Pt was in his room in the dark screaming sounding like he was crying so I checked to make sure he was okay and he turned nornal and said you prob heard me talking to myself. I am okay.

## 2023-01-11 NOTE — Progress Notes (Addendum)
Received Ronnie Herman this AM asleep in his bed, he woke up on his own, ate breakfast  and denied all of the psychiatric symptoms.

## 2023-01-11 NOTE — ED Provider Notes (Signed)
Behavioral Health Progress Note  Date and Time: 01/11/2023 2:34 PM Name: Ronnie Herman MRN:  [id number]  Subjective:  Ronnie Herman is a 37 year old male with a psychiatric history of bipolar 1 disorder and polysubstance use (crack cocaine, meth, and marijuana) who was admitted to [facility] 4/1 for detox and assistance with substance use treatment.   On assessment today, patient no longer reports feeling tired although reporting poor sleep. He has been out of his room, participating in groups. He denies withdrawal symptoms at this time.  He denies SI, HI, and AVH. We talk extensively about patient's traumatic childhood, his ambivalence about whether he is ready to discontinue using at this time, as well as his thoughts of himself. Brief psychodynamic psychotherapy is provided, and patient is given self-reflective assignments to complete. After this conversation, patient is willing to "put himself first" and continue to stay the course while awaiting residential substance use treatment.   Diagnosis:  Final diagnoses:  Polysubstance abuse    Total Time spent with patient: 30 minutes  Past Psychiatric History: Patient has a several forted psychiatric history of bipolar 1 disorder and polysubstance abuse (methamphetamines, cocaine, and marijuana).  Past Medical History:  Past Medical History:  Diagnosis Date   Bipolar 1 disorder (HCC)    Drug abuse (HCC)     Family History: None reported Social History: "using meth, cocaine, and marijuana daily for the past 4 years. He has participated in residential substance abuse treatment in the past roughly 3-4 years ago. He is unsure of the amount that he is using but he is snorting cocaine and smoking daily. He is injecting methamphetamines daily."  Additional Social History:    Pain Medications: SEE MAR Prescriptions: SEE MAR Over the Counter: SEE MAR History of alcohol / drug use?: Yes Longest period of sobriety (when/how long):  UNKNOWN Negative Consequences of Use: Personal relationships, Surveyor, quantity, Work / School Withdrawal Symptoms: None Name of Substance 1: METH 1 - Age of First Use: 31 1 - Amount (size/oz): 1/2 GRAM 1 - Frequency: DAILY 1 - Duration: ONGOING 1 - Last Use / Amount: TWO DAYS AGO 1 - Method of Aquiring: UNKNOWN 1- Route of Use: SMOKE AND IV Name of Substance 2: COCAINE 2 - Age of First Use: 31 2 - Amount (size/oz): UNKNOWN 2 - Frequency: DAILY 2 - Duration: 2 WEEKS 2 - Last Use / Amount: TODAY 2 - Method of Aquiring: UNKNOWN 2 - Route of Substance Use: SMOKING Name of Substance 3: THC 3 - Age of First Use: 14 3 - Amount (size/oz): 2 BLUNTS 3 - Frequency: DAILY 3 - Duration: ONGOING 3 - Last Use / Amount: TODAY 3 - Method of Aquiring: UNKNOWN 3 - Route of Substance Use: SMOKING              Sleep: Poor  Appetite:  Good  Current Medications:  Current Facility-Administered Medications  Medication Dose Route Frequency Provider Last Rate Last Admin   acetaminophen (TYLENOL) tablet 650 mg  650 mg Oral Q6H PRN Ardis Hughs, NP       alum & mag hydroxide-simeth (MAALOX/MYLANTA) 200-200-20 MG/5ML suspension 30 mL  30 mL Oral Q4H PRN Ardis Hughs, NP   30 mL at 01/11/23 4098   cloNIDine (CATAPRES) tablet 0.1 mg  0.1 mg Oral Q6H PRN Lamar Sprinkles, MD       dicyclomine (BENTYL) tablet 20 mg  20 mg Oral Q6H PRN Lamar Sprinkles, MD       hydrOXYzine (ATARAX) tablet  25 mg  25 mg Oral Q6H PRN Lamar Sprinkles, MD   25 mg at 01/09/23 2150   hydrOXYzine (ATARAX) tablet 50 mg  50 mg Oral QHS PRN Lamar Sprinkles, MD       loperamide (IMODIUM) capsule 2-4 mg  2-4 mg Oral PRN Lamar Sprinkles, MD       magnesium hydroxide (MILK OF MAGNESIA) suspension 30 mL  30 mL Oral Daily PRN Ardis Hughs, NP   30 mL at 01/09/23 1602   methocarbamol (ROBAXIN) tablet 500 mg  500 mg Oral Q8H PRN Lamar Sprinkles, MD       naproxen (NAPROSYN) tablet 500 mg  500 mg Oral BID PRN Lamar Sprinkles,  MD       ondansetron (ZOFRAN-ODT) disintegrating tablet 4 mg  4 mg Oral Q6H PRN Lamar Sprinkles, MD       QUEtiapine (SEROQUEL) tablet 50 mg  50 mg Oral QHS Lamar Sprinkles, MD       No current outpatient medications on file.    Labs  Lab Results:  Admission on 01/07/2023  Component Date Value Ref Range Status   WBC 01/07/2023 6.2  4.0 - 10.5 K/uL Final   RBC 01/07/2023 4.48  4.22 - 5.81 MIL/uL Final   Hemoglobin 01/07/2023 13.5  13.0 - 17.0 g/dL Final   HCT 47/82/9562 43.4  39.0 - 52.0 % Final   MCV 01/07/2023 96.9  80.0 - 100.0 fL Final   MCH 01/07/2023 30.1  26.0 - 34.0 pg Final   MCHC 01/07/2023 31.1  30.0 - 36.0 g/dL Final   RDW 13/05/6577 13.2  11.5 - 15.5 % Final   Platelets 01/07/2023 289  150 - 400 K/uL Final   nRBC 01/07/2023 0.0  0.0 - 0.2 % Final   Neutrophils Relative % 01/07/2023 65  % Final   Neutro Abs 01/07/2023 4.1  1.7 - 7.7 K/uL Final   Lymphocytes Relative 01/07/2023 25  % Final   Lymphs Abs 01/07/2023 1.5  0.7 - 4.0 K/uL Final   Monocytes Relative 01/07/2023 7  % Final   Monocytes Absolute 01/07/2023 0.4  0.1 - 1.0 K/uL Final   Eosinophils Relative 01/07/2023 2  % Final   Eosinophils Absolute 01/07/2023 0.1  0.0 - 0.5 K/uL Final   Basophils Relative 01/07/2023 1  % Final   Basophils Absolute 01/07/2023 0.0  0.0 - 0.1 K/uL Final   Immature Granulocytes 01/07/2023 0  % Final   Abs Immature Granulocytes 01/07/2023 0.01  0.00 - 0.07 K/uL Final   Performed at [facility], [address] [place], [place] [address]   Sodium 01/07/2023 138  135 - 145 mmol/L Final   Potassium 01/07/2023 4.1  3.5 - 5.1 mmol/L Final   Chloride 01/07/2023 100  98 - 111 mmol/L Final   CO2 01/07/2023 28  22 - 32 mmol/L Final   Glucose, Bld 01/07/2023 121 (H)  70 - 99 mg/dL Final   Glucose reference range applies only to samples taken after fasting for at least 8 hours.   BUN 01/07/2023 12  6 - 20 mg/dL Final   Creatinine, Ser 01/07/2023 0.86  0.61 - 1.24 mg/dL Final   Calcium  95/28/4132 9.3  8.9 - 10.3 mg/dL Final   Total Protein 44/10/270 6.3 (L)  6.5 - 8.1 g/dL Final   Albumin 53/66/4403 3.8  3.5 - 5.0 g/dL Final   AST 47/42/5956 19  15 - 41 U/L Final   ALT 01/07/2023 17  0 - 44 U/L Final  Alkaline Phosphatase 01/07/2023 76  38 - 126 U/L Final   Total Bilirubin 01/07/2023 0.6  0.3 - 1.2 mg/dL Final   GFR, Estimated 01/07/2023 >60  >60 mL/min Final   Comment: (NOTE) Calculated using the CKD-EPI Creatinine Equation (2021)    Anion gap 01/07/2023 10  5 - 15 Final   Performed at [facility], [address], [place], [place] [address]   Hgb A1c MFr Bld 01/07/2023 4.9  4.8 - 5.6 % Final   Comment: (NOTE)         Prediabetes: 5.7 - 6.4         Diabetes: >6.4         Glycemic control for adults with diabetes: <7.0    Mean Plasma Glucose 01/07/2023 94  mg/dL Final   Comment: (NOTE) Performed At: [facility] [address] [place], [place] [id number] Jolene Schimke MD [id number]    Magnesium 01/07/2023 2.2  1.7 - 2.4 mg/dL Final   Performed at [facility], [address], [place], [place] [address]   Alcohol, Ethyl (B) 01/07/2023 <10  <10 mg/dL Final   Comment: (NOTE) Lowest detectable limit for serum alcohol is 10 mg/dL.  For medical purposes only. Performed at [facility], [address], [place], [place] [address]    Cholesterol 01/07/2023 184  0 - 200 mg/dL Final   Triglycerides 62/95/2841 151 (H)  <150 mg/dL Final   HDL 32/44/0102 56  >40 mg/dL Final   Total CHOL/HDL Ratio 01/07/2023 3.3  RATIO Final   VLDL 01/07/2023 30  0 - 40 mg/dL Final   LDL Cholesterol 01/07/2023 98  0 - 99 mg/dL Final   Comment:        Total Cholesterol/HDL:CHD Risk Coronary Heart Disease Risk Table                     Men   Women  1/2 Average Risk   3.4   3.3  Average Risk       5.0   4.4  2 X Average Risk   9.6   7.1  3 X Average Risk  23.4   11.0        Use the calculated Patient Ratio above and the CHD Risk Table to determine  the patient's CHD Risk.        ATP III CLASSIFICATION (LDL):  <100     mg/dL   Optimal  725-366  mg/dL   Near or Above                    Optimal  130-159  mg/dL   Borderline  440-347  mg/dL   High  >425     mg/dL   Very High Performed at [facility], [address], [place], [place] [address]    TSH 01/07/2023 0.336 (L)  0.350 - 4.500 uIU/mL Final   Comment: Performed by a 3rd Generation assay with a functional sensitivity of <=0.01 uIU/mL. Performed at [facility], [address] [place], [place] [address]    Chlamydia 01/07/2023 Negative   Final   Neisseria Gonorrhea 01/07/2023 Negative   Final   Comment 01/07/2023 Normal Reference Ranger Chlamydia - Negative   Final   Comment 01/07/2023 Normal Reference Range Neisseria Gonorrhea - Negative   Final   RPR Ser Ql 01/07/2023 NON REACTIVE  NON REACTIVE Final   Performed at [facility], [address], [place], [place]  [address]   POC Amphetamine UR 01/07/2023 None Detected  NONE DETECTED (Cut Off Level 1000 ng/mL) Final   POC Secobarbital (BAR) 01/07/2023 None Detected  NONE DETECTED (Cut Off Level 300 ng/mL) Final   POC Buprenorphine (BUP) 01/07/2023 None Detected  NONE DETECTED (Cut Off Level 10 ng/mL) Final   POC Oxazepam (BZO) 01/07/2023 None Detected  NONE DETECTED (Cut Off Level 300 ng/mL) Final   POC Cocaine UR 01/07/2023 Positive (A)  NONE DETECTED (Cut Off Level 300 ng/mL) Final   POC Methamphetamine UR 01/07/2023 Positive (A)  NONE DETECTED (Cut Off Level 1000 ng/mL) Final   POC Morphine 01/07/2023 None Detected  NONE DETECTED (Cut Off Level 300 ng/mL) Final   POC Methadone UR 01/07/2023 None Detected  NONE DETECTED (Cut Off Level 300 ng/mL) Final   POC Oxycodone UR 01/07/2023 None Detected  NONE DETECTED (Cut Off Level 100 ng/mL) Final   POC Marijuana UR 01/07/2023 Positive (A)  NONE DETECTED (Cut Off Level 50 ng/mL) Final   Color, Urine 01/07/2023 YELLOW  YELLOW Final   APPearance 01/07/2023 CLEAR   CLEAR Final   Specific Gravity, Urine 01/07/2023 1.026  1.005 - 1.030 Final   pH 01/07/2023 5.0  5.0 - 8.0 Final   Glucose, UA 01/07/2023 NEGATIVE  NEGATIVE mg/dL Final   Hgb urine dipstick 01/07/2023 NEGATIVE  NEGATIVE Final   Bilirubin Urine 01/07/2023 NEGATIVE  NEGATIVE Final   Ketones, ur 01/07/2023 5 (A)  NEGATIVE mg/dL Final   Protein, ur 60/45/4098 NEGATIVE  NEGATIVE mg/dL Final   Nitrite 11/91/4782 NEGATIVE  NEGATIVE Final   Leukocytes,Ua 01/07/2023 NEGATIVE  NEGATIVE Final   Performed at [facility], [address] [place], [place] [address]   HIV Screen 4th Generation wRfx 01/07/2023 Non Reactive  Non Reactive Final   Performed at [facility], [address] [place], [place] [address]   SARSCOV2ONAVIRUS 2 AG 01/07/2023 NEGATIVE  NEGATIVE Final   Comment: (NOTE) SARS-CoV-2 antigen NOT DETECTED.   Negative results are presumptive.  Negative results do not preclude SARS-CoV-2 infection and should not be used as the sole basis for treatment or other patient management decisions, including infection  control decisions, particularly in the presence of clinical signs and  symptoms consistent with COVID-19, or in those who have been in contact with the virus.  Negative results must be combined with clinical observations, patient history, and epidemiological information. The expected result is Negative.  Fact Sheet for Patients: https://www.jennings-kim.com/  Fact Sheet for Healthcare Providers: https://alexander-rogers.biz/  This test is not yet approved or cleared by the [place] FDA and  has been authorized for detection and/or diagnosis of SARS-CoV-2 by FDA under an Emergency Use Authorization (EUA).  This EUA will remain in effect (meaning this test can be used) for the duration of  the COV                          ID-19 declaration under Section 564(b)(1) of the Act, 21 U.S.C. section 360bbb-3(b)(1), unless the authorization  is terminated or revoked sooner.     SARS Coronavirus 2 by RT PCR 01/07/2023 NEGATIVE  NEGATIVE Final   Performed at [facility], [address] [place], [place] [address]  Admission on 11/14/2022, Discharged on 11/16/2022  Component Date Value Ref Range Status   Sodium 11/14/2022 134 (L)  135 - 145 mmol/L Final   Potassium 11/14/2022 3.5  3.5 - 5.1 mmol/L Final   Chloride 11/14/2022 95 (  L)  98 - 111 mmol/L Final   CO2 11/14/2022 27  22 - 32 mmol/L Final   Glucose, Bld 11/14/2022 104 (H)  70 - 99 mg/dL Final   Glucose reference range applies only to samples taken after fasting for at least 8 hours.   BUN 11/14/2022 15  6 - 20 mg/dL Final   Creatinine, Ser 11/14/2022 1.19  0.61 - 1.24 mg/dL Final   Calcium 16/07/9603 9.0  8.9 - 10.3 mg/dL Final   GFR, Estimated 11/14/2022 >60  >60 mL/min Final   Comment: (NOTE) Calculated using the CKD-EPI Creatinine Equation (2021)    Anion gap 11/14/2022 12  5 - 15 Final   Performed at [facility], [address] [place], [place] [address]   WBC 11/14/2022 25.4 (H)  4.0 - 10.5 K/uL Final   RBC 11/14/2022 4.30  4.22 - 5.81 MIL/uL Final   Hemoglobin 11/14/2022 13.4  13.0 - 17.0 g/dL Final   HCT 11/91/4782 41.8  39.0 - 52.0 % Final   MCV 11/14/2022 97.2  80.0 - 100.0 fL Final   MCH 11/14/2022 31.2  26.0 - 34.0 pg Final   MCHC 11/14/2022 32.1  30.0 - 36.0 g/dL Final   RDW 95/62/1308 13.3  11.5 - 15.5 % Final   Platelets 11/14/2022 325  150 - 400 K/uL Final   nRBC 11/14/2022 0.0  0.0 - 0.2 % Final   Performed at [facility], [address] [place], [place] [address]   Troponin I (High Sensitivity) 11/14/2022 2  <18 ng/L Final   Comment: (NOTE) Elevated high sensitivity troponin I (hsTnI) values and significant  changes across serial measurements may suggest ACS but many other  chronic and acute conditions are known to elevate hsTnI results.  Refer to the "Links" section for chest pain algorithms and  additional  guidance. Performed at [facility], [address] [place], [place] [address]    SARS Coronavirus 2 by RT PCR 11/14/2022 NEGATIVE  NEGATIVE Final   Comment: (NOTE) SARS-CoV-2 target nucleic acids are NOT DETECTED.  The SARS-CoV-2 RNA is generally detectable in upper respiratory specimens during the acute phase of infection. The lowest concentration of SARS-CoV-2 viral copies this assay can detect is 138 copies/mL. A negative result does not preclude SARS-Cov-2 infection and should not be used as the sole basis for treatment or other patient management decisions. A negative result may occur with  improper specimen collection/handling, submission of specimen other than nasopharyngeal swab, presence of viral mutation(s) within the areas targeted by this assay, and inadequate number of viral copies(<138 copies/mL). A negative result must be combined with clinical observations, patient history, and epidemiological information. The expected result is Negative.  Fact Sheet for Patients:  BloggerCourse.com  Fact Sheet for Healthcare Providers:  SeriousBroker.it  This test is no                          t yet approved or cleared by the [place] FDA and  has been authorized for detection and/or diagnosis of SARS-CoV-2 by FDA under an Emergency Use Authorization (EUA). This EUA will remain  in effect (meaning this test can be used) for the duration of the COVID-19 declaration under Section 564(b)(1) of the Act, 21 U.S.C.section 360bbb-3(b)(1), unless the authorization is terminated  or revoked sooner.       Influenza A by PCR 11/14/2022 NEGATIVE  NEGATIVE Final   Influenza B by PCR 11/14/2022 NEGATIVE  NEGATIVE Final   Comment: (NOTE) The Xpert Xpress SARS-CoV-2/FLU/RSV plus assay is intended as an aid in the diagnosis of influenza from Nasopharyngeal swab specimens and should not be used as a sole  basis for treatment. Nasal washings and aspirates are unacceptable for Xpert Xpress SARS-CoV-2/FLU/RSV testing.  Fact Sheet for Patients: BloggerCourse.com  Fact Sheet for Healthcare Providers: SeriousBroker.it  This test is not yet approved or cleared by the [place] FDA and has been authorized for detection and/or diagnosis of SARS-CoV-2 by FDA under an Emergency Use Authorization (EUA). This EUA will remain in effect (meaning this test can be used) for the duration of the COVID-19 declaration under Section 564(b)(1) of the Act, 21 U.S.C. section 360bbb-3(b)(1), unless the authorization is terminated or revoked.     Resp Syncytial Virus by PCR 11/14/2022 NEGATIVE  NEGATIVE Final   Comment: (NOTE) Fact Sheet for Patients: BloggerCourse.com  Fact Sheet for Healthcare Providers: SeriousBroker.it  This test is not yet approved or cleared by the [place] FDA and has been authorized for detection and/or diagnosis of SARS-CoV-2 by FDA under an Emergency Use Authorization (EUA). This EUA will remain in effect (meaning this test can be used) for the duration of the COVID-19 declaration under Section 564(b)(1) of the Act, 21 U.S.C. section 360bbb-3(b)(1), unless the authorization is terminated or revoked.  Performed at [facility], [address], [place], [place] [address]    D-Dimer, Quant 11/14/2022 1.78 (H)  0.00 - 0.50 ug/mL-FEU Final   Comment: (NOTE) At the manufacturer cut-off value of 0.5 g/mL FEU, this assay has a negative predictive value of 95-100%.This assay is intended for use in conjunction with a clinical pretest probability (PTP) assessment model to exclude pulmonary embolism (PE) and deep venous thrombosis (DVT) in outpatients suspected of PE or DVT. Results should be correlated with clinical presentation. Performed at [facility], [address], [place], [place] [address]    Opiates 11/14/2022 NONE DETECTED  NONE DETECTED Final   Cocaine 11/14/2022 NONE DETECTED  NONE DETECTED Final   Benzodiazepines 11/14/2022 NONE DETECTED  NONE DETECTED Final   Amphetamines 11/14/2022 POSITIVE (A)  NONE DETECTED Final   Tetrahydrocannabinol 11/14/2022 POSITIVE (A)  NONE DETECTED Final   Barbiturates 11/14/2022 NONE DETECTED  NONE DETECTED Final   Comment: (NOTE) DRUG SCREEN FOR MEDICAL PURPOSES ONLY.  IF CONFIRMATION IS NEEDED FOR ANY PURPOSE, NOTIFY LAB WITHIN 5 DAYS.  LOWEST DETECTABLE LIMITS FOR URINE DRUG SCREEN Drug Class                     Cutoff (ng/mL) Amphetamine and metabolites    1000 Barbiturate and metabolites    200 Benzodiazepine                 200 Opiates and metabolites        300 Cocaine and metabolites        300 THC                            50 Performed at [facility], [address], [place], [place] [address]    Troponin I (High Sensitivity) 11/14/2022 3  <18 ng/L Final   Comment: (NOTE) Elevated high sensitivity troponin I (hsTnI) values and significant  changes across serial measurements may suggest ACS but many other  chronic and acute conditions are known to elevate hsTnI results.  Refer to the "Links" section for chest pain  algorithms and additional  guidance. Performed at [facility], [address], [place], [place] [address]    HIV Screen 4th Generation wRfx 11/14/2022 Non Reactive  Non Reactive Final   Performed at [facility], [address] [place], [place] [address]   Strep Pneumo Urinary Antigen 11/14/2022 NEGATIVE  NEGATIVE Final   Comment:        Infection due to S. pneumoniae cannot be absolutely ruled out since the antigen present may be below the detection limit of the test. Performed at [facility], [address], [place], [place] [address]    L. pneumophila Serogp 1 Ur Ag 11/14/2022  Negative  Negative Final   Comment: (NOTE) Presumptive negative for L. pneumophila serogroup 1 antigen in urine, suggesting no recent or current infection. Legionnaires' disease cannot be ruled out since other serogroups and species may also cause disease. Performed At: [facility] [address] [place], [place] [id number] Jolene Schimke MD [id number]    Source of Sample 11/14/2022 URINE, CLEAN CATCH   Final   Performed at [facility], [address], [place], [place] [address]   Sodium 11/15/2022 133 (L)  135 - 145 mmol/L Final   Potassium 11/15/2022 3.3 (L)  3.5 - 5.1 mmol/L Final   Chloride 11/15/2022 99  98 - 111 mmol/L Final   CO2 11/15/2022 22  22 - 32 mmol/L Final   Glucose, Bld 11/15/2022 116 (H)  70 - 99 mg/dL Final   Glucose reference range applies only to samples taken after fasting for at least 8 hours.   BUN 11/15/2022 10  6 - 20 mg/dL Final   Creatinine, Ser 11/15/2022 0.73  0.61 - 1.24 mg/dL Final   Calcium 24/40/1027 7.9 (L)  8.9 - 10.3 mg/dL Final   Total Protein 25/36/6440 5.9 (L)  6.5 - 8.1 g/dL Final   Albumin 34/74/2595 2.9 (L)  3.5 - 5.0 g/dL Final   AST 63/87/5643 22  15 - 41 U/L Final   ALT 11/15/2022 22  0 - 44 U/L Final   Alkaline Phosphatase 11/15/2022 68  38 - 126 U/L Final   Total Bilirubin 11/15/2022 1.7 (H)  0.3 - 1.2 mg/dL Final   GFR, Estimated 11/15/2022 >60  >60 mL/min Final   Comment: (NOTE) Calculated using the CKD-EPI Creatinine Equation (2021)    Anion gap 11/15/2022 12  5 - 15 Final   Performed at [facility], [address], [place], [place] [address]   WBC 11/15/2022 25.4 (H)  4.0 - 10.5 K/uL Final   RBC 11/15/2022 3.27 (L)  4.22 - 5.81 MIL/uL Final   Hemoglobin 11/15/2022 10.2 (L)  13.0 - 17.0 g/dL Final   HCT 88/41/6606 32.0 (L)  39.0 - 52.0 % Final   MCV 11/15/2022 97.9  80.0 - 100.0 fL Final   MCH 11/15/2022 31.2  26.0 - 34.0 pg Final   MCHC 11/15/2022 31.9  30.0 - 36.0 g/dL Final    RDW 30/16/0109 13.5  11.5 - 15.5 % Final   Platelets 11/15/2022 245  150 - 400 K/uL Final   nRBC 11/15/2022 0.0  0.0 - 0.2 % Final   Neutrophils Relative % 11/15/2022 87  % Final   Neutro Abs 11/15/2022 21.9 (H)  1.7 - 7.7 K/uL Final   Lymphocytes Relative 11/15/2022 4  % Final   Lymphs Abs 11/15/2022 1.0  0.7 - 4.0 K/uL Final   Monocytes Relative 11/15/2022 6  % Final   Monocytes  Absolute 11/15/2022 1.6 (H)  0.1 - 1.0 K/uL Final   Eosinophils Relative 11/15/2022 0  % Final   Eosinophils Absolute 11/15/2022 0.0  0.0 - 0.5 K/uL Final   Basophils Relative 11/15/2022 0  % Final   Basophils Absolute 11/15/2022 0.1  0.0 - 0.1 K/uL Final   Immature Granulocytes 11/15/2022 3  % Final   Abs Immature Granulocytes 11/15/2022 0.75 (H)  0.00 - 0.07 K/uL Final   Performed at [facility], [address], [place], [place] [address]   Phosphorus 11/15/2022 3.3  2.5 - 4.6 mg/dL Final   Performed at [facility], [address], [place], [place] [address]   Magnesium 11/15/2022 1.8  1.7 - 2.4 mg/dL Final   Performed at [facility], [address], [place], [place] [address]   Specimen Description 11/14/2022    Final                   Value:BLOOD RIGHT ANTECUBITAL Performed at [facility], [address] [place], [place] [address]    Special Requests 11/14/2022    Final                   Value:BOTTLES DRAWN AEROBIC AND ANAEROBIC Blood Culture adequate volume Performed at [facility], [address], [place], [place] [address]    Culture 11/14/2022    Final                   Value:NO GROWTH 5 DAYS Performed at [facility], [address], [place], [place] [address]    Report Status 11/14/2022 11/19/2022 FINAL   Final   Specimen Description 11/14/2022    Final                   Value:BLOOD BLOOD RIGHT HAND Performed at [facility], [address] [place], [place]  [address]    Special Requests 11/14/2022    Final                   Value:BOTTLES DRAWN AEROBIC AND ANAEROBIC Blood Culture adequate volume Performed at [facility], [address], [place], [place] [address]    Culture 11/14/2022    Final                   Value:NO GROWTH 5 DAYS Performed at [facility], [address] [place], [place] [address]    Report Status 11/14/2022 11/20/2022 FINAL   Final   Lactic Acid, Venous 11/15/2022 1.1  0.5 - 1.9 mmol/L Final   Performed at [facility], [address] [place], [place] [address]   Lactic Acid, Venous 11/15/2022 3.0 (HH)  0.5 - 1.9 mmol/L Final   Comment: CRITICAL RESULT CALLED TO, READ BACK BY AND VERIFIED WITH DARK,A. RN AT 1132 11/15/22 MULLINS,T Performed at [facility], [address], [place], [place] [address]    MRSA by PCR Next Gen 11/15/2022 NOT DETECTED  NOT DETECTED Final   Comment: (NOTE) The GeneXpert MRSA Assay (FDA approved for NASAL specimens only), is one component of a comprehensive MRSA colonization surveillance program. It is not intended to diagnose MRSA infection nor to guide or monitor treatment for MRSA infections. Test performance is not FDA approved in patients less than 39 years old. Performed at [facility], [address], [place], [place] [address]    Lactic  Acid, Venous 11/15/2022 1.3  0.5 - 1.9 mmol/L Final   Performed at [facility], [address] [place], [place] [address]   WBC 11/16/2022 13.9 (H)  4.0 - 10.5 K/uL Final   RBC 11/16/2022 3.12 (L)  4.22 - 5.81 MIL/uL Final   Hemoglobin 11/16/2022 9.7 (L)  13.0 - 17.0 g/dL Final   HCT 60/45/409802/06/2023 30.5 (L)  39.0 - 52.0 % Final   MCV 11/16/2022 97.8  80.0 - 100.0 fL Final   MCH 11/16/2022 31.1  26.0 - 34.0 pg Final   MCHC 11/16/2022 31.8  30.0 - 36.0 g/dL Final   RDW 11/91/478202/06/2023 13.5  11.5 - 15.5 % Final   Platelets 11/16/2022 243  150 - 400 K/uL  Final   nRBC 11/16/2022 0.0  0.0 - 0.2 % Final   Neutrophils Relative % 11/16/2022 76  % Final   Neutro Abs 11/16/2022 10.6 (H)  1.7 - 7.7 K/uL Final   Lymphocytes Relative 11/16/2022 12  % Final   Lymphs Abs 11/16/2022 1.6  0.7 - 4.0 K/uL Final   Monocytes Relative 11/16/2022 10  % Final   Monocytes Absolute 11/16/2022 1.4 (H)  0.1 - 1.0 K/uL Final   Eosinophils Relative 11/16/2022 1  % Final   Eosinophils Absolute 11/16/2022 0.1  0.0 - 0.5 K/uL Final   Basophils Relative 11/16/2022 0  % Final   Basophils Absolute 11/16/2022 0.0  0.0 - 0.1 K/uL Final   Immature Granulocytes 11/16/2022 1  % Final   Abs Immature Granulocytes 11/16/2022 0.11 (H)  0.00 - 0.07 K/uL Final   Performed at [facility], [address] [place], [place] [address]   Sodium 11/16/2022 135  135 - 145 mmol/L Final   Potassium 11/16/2022 3.8  3.5 - 5.1 mmol/L Final   Chloride 11/16/2022 103  98 - 111 mmol/L Final   CO2 11/16/2022 23  22 - 32 mmol/L Final   Glucose, Bld 11/16/2022 88  70 - 99 mg/dL Final   Glucose reference range applies only to samples taken after fasting for at least 8 hours.   BUN 11/16/2022 8  6 - 20 mg/dL Final   Creatinine, Ser 11/16/2022 0.75  0.61 - 1.24 mg/dL Final   Calcium 30/86/578402/06/2023 7.8 (L)  8.9 - 10.3 mg/dL Final   Total Protein 69/62/952802/06/2023 6.0 (L)  6.5 - 8.1 g/dL Final   Albumin 41/32/440102/06/2023 2.5 (L)  3.5 - 5.0 g/dL Final   AST 02/72/536602/06/2023 13 (L)  15 - 41 U/L Final   ALT 11/16/2022 15  0 - 44 U/L Final   Alkaline Phosphatase 11/16/2022 57  38 - 126 U/L Final   Total Bilirubin 11/16/2022 0.7  0.3 - 1.2 mg/dL Final   GFR, Estimated 11/16/2022 >60  >60 mL/min Final   Comment: (NOTE) Calculated using the CKD-EPI Creatinine Equation (2021)    Anion gap 11/16/2022 9  5 - 15 Final   Performed at [facility], [address] [place], [place] [address]   Magnesium 11/16/2022 2.0  1.7 - 2.4 mg/dL Final   Performed at [facility], [address] [place], [place] [address]   Procalcitonin 11/16/2022 4.93  ng/mL Final   Comment:        Interpretation: PCT > 2 ng/mL: Systemic infection (sepsis) is likely, unless other causes are known. (NOTE)       Sepsis PCT Algorithm           Lower Respiratory Tract  Infection PCT Algorithm    ----------------------------     ----------------------------         PCT < 0.25 ng/mL                PCT < 0.10 ng/mL          Strongly encourage             Strongly discourage   discontinuation of antibiotics    initiation of antibiotics    ----------------------------     -----------------------------       PCT 0.25 - 0.50 ng/mL            PCT 0.10 - 0.25 ng/mL               OR       >80% decrease in PCT            Discourage initiation of                                            antibiotics      Encourage discontinuation           of antibiotics    ----------------------------     -----------------------------         PCT >= 0.50 ng/mL              PCT 0.26 - 0.50 ng/mL               AND       <80% decrease in PCT                                       Encourage initiation of                                             antibiotics       Encourage continuation           of antibiotics    ----------------------------     -----------------------------        PCT >= 0.50 ng/mL                  PCT > 0.50 ng/mL               AND         increase in PCT                  Strongly encourage                                      initiation of antibiotics    Strongly encourage escalation           of antibiotics                                     -----------------------------  PCT <= 0.25 ng/mL                                                 OR                                        > 80% decrease in PCT                                      Discontinue / Do not initiate                                              antibiotics  Performed at [facility] [address], [place], [place] [address]   Admission on 10/29/2022, Discharged on 10/30/2022  Component Date Value Ref Range Status   SARS Coronavirus 2 by RT PCR 10/29/2022 NEGATIVE  NEGATIVE Final   Comment: (NOTE) SARS-CoV-2 target nucleic acids are NOT DETECTED.  The SARS-CoV-2 RNA is generally detectable in upper respiratory specimens during the acute phase of infection. The lowest concentration of SARS-CoV-2 viral copies this assay can detect is 138 copies/mL. A negative result does not preclude SARS-Cov-2 infection and should not be used as the sole basis for treatment or other patient management decisions. A negative result may occur with  improper specimen collection/handling, submission of specimen other than nasopharyngeal swab, presence of viral mutation(s) within the areas targeted by this assay, and inadequate number of viral copies(<138 copies/mL). A negative result must be combined with clinical observations, patient history, and epidemiological information. The expected result is Negative.  Fact Sheet for Patients:  EntrepreneurPulse.com.au  Fact Sheet for Healthcare Providers:  IncredibleEmployment.be  This test is no                          t yet approved or cleared by the [place] FDA and  has been authorized for detection and/or diagnosis of SARS-CoV-2 by FDA under an Emergency Use Authorization (EUA). This EUA will remain  in effect (meaning this test can be used) for the duration of the COVID-19 declaration under Section 564(b)(1) of the Act, 21 U.S.C.section 360bbb-3(b)(1), unless the authorization is terminated  or revoked sooner.       Influenza A by PCR 10/29/2022 NEGATIVE  NEGATIVE Final   Influenza B by PCR 10/29/2022 NEGATIVE  NEGATIVE Final   Comment: (NOTE) The Xpert Xpress SARS-CoV-2/FLU/RSV plus assay is intended as an aid in the  diagnosis of influenza from Nasopharyngeal swab specimens and should not be used as a sole basis for treatment. Nasal washings and aspirates are unacceptable for Xpert Xpress SARS-CoV-2/FLU/RSV testing.  Fact Sheet for Patients: EntrepreneurPulse.com.au  Fact Sheet for Healthcare Providers: IncredibleEmployment.be  This test is not yet approved or cleared by the [place] FDA and has been authorized for detection and/or diagnosis of SARS-CoV-2 by FDA under an Emergency Use Authorization (EUA). This EUA will remain in effect (meaning this test can be used) for the duration of the COVID-19 declaration under Section 564(b)(1) of  the Act, 21 U.S.C. section 360bbb-3(b)(1), unless the authorization is terminated or revoked.     Resp Syncytial Virus by PCR 10/29/2022 NEGATIVE  NEGATIVE Final   Comment: (NOTE) Fact Sheet for Patients: BloggerCourse.com  Fact Sheet for Healthcare Providers: SeriousBroker.it  This test is not yet approved or cleared by the [place] FDA and has been authorized for detection and/or diagnosis of SARS-CoV-2 by FDA under an Emergency Use Authorization (EUA). This EUA will remain in effect (meaning this test can be used) for the duration of the COVID-19 declaration under Section 564(b)(1) of the Act, 21 U.S.C. section 360bbb-3(b)(1), unless the authorization is terminated or revoked.  Performed at [facility], [address] [place], [place] [address]    Group A Strep by PCR 10/29/2022 NOT DETECTED  NOT DETECTED Final   Performed at [facility], [address] [place], [place] [address]    Blood Alcohol level:  Lab Results  Component Value Date   ETH <10 01/07/2023    Metabolic Disorder Labs: Lab Results  Component Value Date   HGBA1C 4.9 01/07/2023   MPG 94 01/07/2023   No results found for: "PROLACTIN" Lab  Results  Component Value Date   CHOL 184 01/07/2023   TRIG 151 (H) 01/07/2023   HDL 56 01/07/2023   CHOLHDL 3.3 01/07/2023   VLDL 30 01/07/2023   LDLCALC 98 01/07/2023    Therapeutic Lab Levels: No results found for: "LITHIUM" No results found for: "VALPROATE" No results found for: "CBMZ"  Physical Findings   PHQ2-9    Flowsheet Row ED from 01/07/2023 in [facility]  PHQ-2 Total Score 2  PHQ-9 Total Score 11      Flowsheet Row ED from 01/07/2023 in [facility] ED to Hosp-Admission (Discharged) from 11/14/2022 in [facility]TH FLOOR [facility] UROLOGY ED from 10/29/2022 in [facility] Emergency Department at [facility]  C-SSRS RISK CATEGORY Low Risk No Risk No Risk        Musculoskeletal  Strength & Muscle Tone: within normal limits Gait & Station: normal Patient leans: N/A  Psychiatric Specialty Exam  Presentation  General Appearance:  Appropriate for Environment; Casual  Eye Contact: Good  Speech: Clear and Coherent; Normal Rate  Speech Volume: Normal  Handedness: Right   Mood and Affect  Mood: Euthymic (Euthymic but questioing/uncertain)  Affect: Congruent; Full Range   Thought Process  Thought Processes: Coherent; Goal Directed; Linear  Descriptions of Associations:Intact  Orientation:Full (Time, Place and Person)  Thought Content:Logical  Diagnosis of Schizophrenia or Schizoaffective disorder in past: No    Hallucinations:Hallucinations: None    Ideas of Reference:None  Suicidal Thoughts:Suicidal Thoughts: No    Homicidal Thoughts:Homicidal Thoughts: No     Sensorium  Memory: Immediate Good; Recent Good  Judgment: Fair  Insight: Fair; Shallow   Executive Functions  Concentration: Good  Attention Span: Good  Recall: Good  Fund of Knowledge: Good  Language: Good   Psychomotor Activity  Psychomotor Activity: Psychomotor  Activity: Normal     Assets  Assets: Communication Skills; Desire for Improvement; Physical Health; Social Support   Sleep  Sleep: Sleep: Poor     Physical Exam  Physical Exam Vitals reviewed.  Constitutional:      General: He is not in acute distress. HENT:     Head: Normocephalic and atraumatic.  Pulmonary:     Effort: Pulmonary effort is normal.  Neurological:     Mental Status: He  is alert.    Review of Systems  Constitutional:  Negative for diaphoresis and weight loss.  Gastrointestinal:  Negative for abdominal pain, diarrhea, nausea and vomiting.   Blood pressure 124/86, pulse 74, temperature 97.7 F (36.5 C), temperature source Oral, resp. rate 18, weight 185 lb (83.9 kg), SpO2 100 %. Body mass index is 25.09 kg/m.  Treatment Plan Summary: Daily contact with patient to assess and evaluate symptoms and progress in treatment and Medication management  Stimulant use disorder Opioid use disorder - Discontinue COWS monitoring as patient has been asymptomatic for greater than 24 hours. - Other PRNs -Maalox 30 ml p.o. every 4 hours as needed indigestion -MOM 30 ml p.o. daily as needed constipation -Melatonin 5 mg p.o. nightly as needed sleep -Bentyl 20 mg p.o. every 6 hours as needed spasms, abdominal cramps -Atarax 25 mg p.o. 3 times daily as needed anxiety -Imodium 2- 4 mg p.o. as needed diarrhea or loose stools -Robaxin 500 mg p.o. every 8 hours as needed muscle spasms -Naproxen 500 mg p.o. twice daily as needed aches and pains -Zofran ODT 4 mg p.o. every 6 hours as needed nausea, vomiting    History of bipolar disorder -Start Seroquel 50 mg qHS for mood instability.  Dispo: Pending  Lamar Sprinkles, MD 01/11/2023 2:34 PM

## 2023-01-11 NOTE — Progress Notes (Signed)
Ronnie Herman remained visible in the milieu throughout the morning and attended the morning group therapy. He completed his Safety Plan, a copy was placed in his chart and he received a copy to take home.

## 2023-01-11 NOTE — ED Notes (Signed)
Patient in dayroom - no sxs of distress noted - will continue to monitor for safety

## 2023-01-11 NOTE — Group Note (Signed)
Group Topic: Healthy Self Image and Positive Change  Group Date: 01/11/2023 Start Time: 0745 End Time: 0810 Facilitators: Emmit Pomfret D, NT  Department: [facility]  Number of Participants: 9  Group Focus: coping skills, daily focus, and problem solving Treatment Modality:  Psychoeducation Interventions utilized were leisure development Purpose: increase insight  Name: Kregg Fallert Date of Birth: 04/21/1986  MR: [id number]    Level of Participation: moderate Quality of Participation: attentive Interactions with others: gave feedback Mood/Affect: appropriate Triggers (if applicable): n/a Cognition: coherent/clear Progress: Moderate Response: n/a Plan: follow-up needed  Patients Problems:  Patient Active Problem List   Diagnosis Date Noted   Sepsis due to pneumonia 11/15/2022   Hypokalemia 11/15/2022   Dental infection 11/15/2022   Lactic acidosis 11/15/2022   Pneumonia of both lower lobes due to infectious organism 11/14/2022   Polysubstance abuse 11/14/2022   Bipolar 1 disorder 11/14/2022

## 2023-01-12 DIAGNOSIS — Z1152 Encounter for screening for COVID-19: Secondary | ICD-10-CM | POA: Diagnosis not present

## 2023-01-12 DIAGNOSIS — F319 Bipolar disorder, unspecified: Secondary | ICD-10-CM | POA: Diagnosis not present

## 2023-01-12 DIAGNOSIS — R45851 Suicidal ideations: Secondary | ICD-10-CM | POA: Diagnosis not present

## 2023-01-12 DIAGNOSIS — F1910 Other psychoactive substance abuse, uncomplicated: Secondary | ICD-10-CM | POA: Diagnosis not present

## 2023-01-12 MED ORDER — DOCUSATE SODIUM 100 MG PO CAPS
100.0000 mg | ORAL_CAPSULE | Freq: Every day | ORAL | Status: DC | PRN
  Administered 2023-01-13: 100 mg via ORAL
  Filled 2023-01-12: qty 1

## 2023-01-12 NOTE — ED Notes (Signed)
Patient is calm and cooperative without complaint or distress.  No withdrawal noted.  Will continue to monitor.

## 2023-01-12 NOTE — ED Notes (Signed)
Pt is attending AA 

## 2023-01-12 NOTE — ED Notes (Signed)
Patient asleep in bed currently without distress or complaint.  Patient is calm and cooperative with care.  No evidence of withdrawal at this time.  Will monitor and provide a safe environment.

## 2023-01-12 NOTE — ED Notes (Signed)
Patient is in room in no sxs of distress - will continue to monitor for safety  

## 2023-01-12 NOTE — ED Provider Notes (Signed)
Behavioral Health Progress Note  Date and Time: 01/11/2023 2:34 PM Name: Ronnie AllegraChristopher Capili MRN:  [id number]  Subjective:  Ronnie Herman is a 37 year old male with a psychiatric history of bipolar 1 disorder and polysubstance use (crack cocaine, meth, and marijuana) who [facility] for detox and assistance with substance use treatment.   On assessment today, patient reports that his mood is "fine ".  He reports some depression and anxiety.  He reports that he was not using opioids.  He was only using Iv meth (daily), crack cocaine (not every day), and marijuana.  He has been sleeping and eating well.  He denies withdrawal symptoms or cravings at this time.  He denies SI, HI, and AVH.  He is reporting some constipation.  He tried MOM.  Discussed adding Colace for constipation.  Patient agrees with the plan.  He reports that he has lost his job recently.  Previously he was working in [employer].  [facility] had [facility] to [facility] before in 2019. [facility].  Diagnosis:  Final diagnoses:  Polysubstance abuse    Total Time spent with patient: 20 minutes  Past Psychiatric History: Patient has a several forted psychiatric history of bipolar 1 disorder and polysubstance abuse (methamphetamines, cocaine, and marijuana).  Past Medical History:  Past Medical History:  Diagnosis Date   Bipolar 1 disorder (HCC)    Drug abuse (HCC)     Family History: None reported Social History: "using meth, cocaine, and marijuana daily for the past 4 years. He has participated in residential substance abuse treatment in the past roughly 3-4 years ago. He is unsure of the amount that he is using but he is snorting cocaine and smoking daily. He is injecting methamphetamines daily."  Additional Social History:    Pain Medications: SEE MAR Prescriptions: SEE MAR Over the Counter: SEE MAR History of alcohol / drug use?: Yes Longest period of sobriety  (when/how long): UNKNOWN Negative Consequences of Use: Personal relationships, Surveyor, quantityinancial, Work / School Withdrawal Symptoms: None Name of Substance 1: METH 1 - Age of First Use: 31 1 - Amount (size/oz): 1/2 GRAM 1 - Frequency: DAILY 1 - Duration: ONGOING 1 - Last Use / Amount: TWO DAYS AGO 1 - Method of Aquiring: UNKNOWN 1- Route of Use: SMOKE AND IV Name of Substance 2: COCAINE 2 - Age of First Use: 31 2 - Amount (size/oz): UNKNOWN 2 - Frequency: DAILY 2 - Duration: 2 WEEKS 2 - Last Use / Amount: TODAY 2 - Method of Aquiring: UNKNOWN 2 - Route of Substance Use: SMOKING Name of Substance 3: THC 3 - Age of First Use: 14 3 - Amount (size/oz): 2 BLUNTS 3 - Frequency: DAILY 3 - Duration: ONGOING 3 - Last Use / Amount: TODAY 3 - Method of Aquiring: UNKNOWN 3 - Route of Substance Use: SMOKING              Sleep: Poor  Appetite:  Good  Current Medications:  Current Facility-Administered Medications  Medication Dose Route Frequency Provider Last Rate Last Admin   acetaminophen (TYLENOL) tablet 650 mg  650 mg Oral Q6H PRN Ardis Hughsoleman, Carolyn H, NP       alum & mag hydroxide-simeth (MAALOX/MYLANTA) 200-200-20 MG/5ML suspension 30 mL  30 mL Oral Q4H PRN Ardis Hughsoleman, Carolyn H, NP   30 mL at 01/11/23 [id number]   cloNIDine (CATAPRES) tablet 0.1 mg  0.1 mg Oral Q6H PRN Lamar Sprinklesosby, Courtney, MD  dicyclomine (BENTYL) tablet 20 mg  20 mg Oral Q6H PRN Lamar Sprinklesosby, Courtney, MD       hydrOXYzine (ATARAX) tablet 25 mg  25 mg Oral Q6H PRN Lamar Sprinklesosby, Courtney, MD   25 mg at 01/11/23 2123   hydrOXYzine (ATARAX) tablet 50 mg  50 mg Oral QHS PRN Lamar Sprinklesosby, Courtney, MD       loperamide (IMODIUM) capsule 2-4 mg  2-4 mg Oral PRN Lamar Sprinklesosby, Courtney, MD       magnesium hydroxide (MILK OF MAGNESIA) suspension 30 mL  30 mL Oral Daily PRN Ardis Hughsoleman, Carolyn H, NP   30 mL at 01/12/23 1306   methocarbamol (ROBAXIN) tablet 500 mg  500 mg Oral Q8H PRN Lamar Sprinklesosby, Courtney, MD       naproxen (NAPROSYN) tablet 500 mg  500 mg Oral BID PRN  Lamar Sprinklesosby, Courtney, MD       ondansetron (ZOFRAN-ODT) disintegrating tablet 4 mg  4 mg Oral Q6H PRN Lamar Sprinklesosby, Courtney, MD       QUEtiapine (SEROQUEL) tablet 50 mg  50 mg Oral QHS Lamar Sprinklesosby, Courtney, MD   50 mg at 01/11/23 2123   No current outpatient medications on file.    Labs  Lab Results:  Admission on 01/07/2023  Component Date Value Ref Range Status   WBC 01/07/2023 6.2  4.0 - 10.5 K/uL Final   RBC 01/07/2023 4.48  4.22 - 5.81 MIL/uL Final   Hemoglobin 01/07/2023 13.5  13.0 - 17.0 g/dL Final   HCT 16/10/960404/10/2022 43.4  39.0 - 52.0 % Final   MCV 01/07/2023 96.9  80.0 - 100.0 fL Final   MCH 01/07/2023 30.1  26.0 - 34.0 pg Final   MCHC 01/07/2023 31.1  30.0 - 36.0 g/dL Final   RDW 54/09/811904/10/2022 13.2  11.5 - 15.5 % Final   Platelets 01/07/2023 289  150 - 400 K/uL Final   nRBC 01/07/2023 0.0  0.0 - 0.2 % Final   Neutrophils Relative % 01/07/2023 65  % Final   Neutro Abs 01/07/2023 4.1  1.7 - 7.7 K/uL Final   Lymphocytes Relative 01/07/2023 25  % Final   Lymphs Abs 01/07/2023 1.5  0.7 - 4.0 K/uL Final   Monocytes Relative 01/07/2023 7  % Final   Monocytes Absolute 01/07/2023 0.4  0.1 - 1.0 K/uL Final   Eosinophils Relative 01/07/2023 2  % Final   Eosinophils Absolute 01/07/2023 0.1  0.0 - 0.5 K/uL Final   Basophils Relative 01/07/2023 1  % Final   Basophils Absolute 01/07/2023 0.0  0.0 - 0.1 K/uL Final   Immature Granulocytes 01/07/2023 0  % Final   Abs Immature Granulocytes 01/07/2023 0.01  0.00 - 0.07 K/uL Final   Performed at [facility], [address] [place], [place] [address]   Sodium 01/07/2023 138  135 - 145 mmol/L Final   Potassium 01/07/2023 4.1  3.5 - 5.1 mmol/L Final   Chloride 01/07/2023 100  98 - 111 mmol/L Final   CO2 01/07/2023 28  22 - 32 mmol/L Final   Glucose, Bld 01/07/2023 121 (H)  70 - 99 mg/dL Final   Glucose reference range applies only to samples taken after fasting for at least 8 hours.   BUN 01/07/2023 12  6 - 20 mg/dL Final   Creatinine, Ser 01/07/2023  0.86  0.61 - 1.24 mg/dL Final   Calcium 95/62/130804/10/2022 9.3  8.9 - 10.3 mg/dL Final   Total Protein 65/78/469604/10/2022 6.3 (L)  6.5 - 8.1 g/dL Final   Albumin 29/52/841304/10/2022 3.8  3.5 -  5.0 g/dL Final   AST 16/07/9603 19  15 - 41 U/L Final   ALT 01/07/2023 17  0 - 44 U/L Final   Alkaline Phosphatase 01/07/2023 76  38 - 126 U/L Final   Total Bilirubin 01/07/2023 0.6  0.3 - 1.2 mg/dL Final   GFR, Estimated 01/07/2023 >60  >60 mL/min Final   Comment: (NOTE) Calculated using the CKD-EPI Creatinine Equation (2021)    Anion gap 01/07/2023 10  5 - 15 Final   Performed at [facility], [address] [place], [place] [address]   Hgb A1c MFr Bld 01/07/2023 4.9  4.8 - 5.6 % Final   Comment: (NOTE)         Prediabetes: 5.7 - 6.4         Diabetes: >6.4         Glycemic control for adults with diabetes: <7.0    Mean Plasma Glucose 01/07/2023 94  mg/dL Final   Comment: (NOTE) Performed At: [facility] [address] [place], [place] [id number] Jolene Schimke MD FA:[id number]    Magnesium 01/07/2023 2.2  1.7 - 2.4 mg/dL Final   Performed at [facility], [address], [place], [place] [address]   Alcohol, Ethyl (B) 01/07/2023 <10  <10 mg/dL Final   Comment: (NOTE) Lowest detectable limit for serum alcohol is 10 mg/dL.  For medical purposes only. Performed at [facility], [address] [place], [place] [address]    Cholesterol 01/07/2023 184  0 - 200 mg/dL Final   Triglycerides 32/44/0102 151 (H)  <150 mg/dL Final   HDL 72/53/6644 56  >40 mg/dL Final   Total CHOL/HDL Ratio 01/07/2023 3.3  RATIO Final   VLDL 01/07/2023 30  0 - 40 mg/dL Final   LDL Cholesterol 01/07/2023 98  0 - 99 mg/dL Final   Comment:        Total Cholesterol/HDL:CHD Risk Coronary Heart Disease Risk Table                     Men   Women  1/2 Average Risk   3.4   3.3  Average Risk       5.0   4.4  2 X Average Risk   9.6   7.1  3 X Average Risk  23.4   11.0        Use the calculated Patient  Ratio above and the CHD Risk Table to determine the patient's CHD Risk.        ATP III CLASSIFICATION (LDL):  <100     mg/dL   Optimal  034-742  mg/dL   Near or Above                    Optimal  130-159  mg/dL   Borderline  595-638  mg/dL   High  >756     mg/dL   Very High Performed at [facility], [address], [place], [place] [address]    TSH 01/07/2023 0.336 (L)  0.350 - 4.500 uIU/mL Final   Comment: Performed by a 3rd Generation assay with a functional sensitivity of <=0.01 uIU/mL. Performed at [facility], [address], [place], [place] [address]    Chlamydia 01/07/2023 Negative   Final   Neisseria Gonorrhea 01/07/2023 Negative   Final   Comment 01/07/2023 Normal Reference Ranger Chlamydia - Negative   Final   Comment 01/07/2023 Normal Reference Range Neisseria Gonorrhea - Negative  Final   RPR Ser Ql 01/07/2023 NON REACTIVE  NON REACTIVE Final   Performed at [facility], [address] [place], [place] [address]   POC Amphetamine UR 01/07/2023 None Detected  NONE DETECTED (Cut Off Level 1000 ng/mL) Final   POC Secobarbital (BAR) 01/07/2023 None Detected  NONE DETECTED (Cut Off Level 300 ng/mL) Final   POC Buprenorphine (BUP) 01/07/2023 None Detected  NONE DETECTED (Cut Off Level 10 ng/mL) Final   POC Oxazepam (BZO) 01/07/2023 None Detected  NONE DETECTED (Cut Off Level 300 ng/mL) Final   POC Cocaine UR 01/07/2023 Positive (A)  NONE DETECTED (Cut Off Level 300 ng/mL) Final   POC Methamphetamine UR 01/07/2023 Positive (A)  NONE DETECTED (Cut Off Level 1000 ng/mL) Final   POC Morphine 01/07/2023 None Detected  NONE DETECTED (Cut Off Level 300 ng/mL) Final   POC Methadone UR 01/07/2023 None Detected  NONE DETECTED (Cut Off Level 300 ng/mL) Final   POC Oxycodone UR 01/07/2023 None Detected  NONE DETECTED (Cut Off Level 100 ng/mL) Final   POC Marijuana UR 01/07/2023 Positive (A)  NONE DETECTED (Cut Off Level 50 ng/mL) Final   Color, Urine 01/07/2023 YELLOW   YELLOW Final   APPearance 01/07/2023 CLEAR  CLEAR Final   Specific Gravity, Urine 01/07/2023 1.026  1.005 - 1.030 Final   pH 01/07/2023 5.0  5.0 - 8.0 Final   Glucose, UA 01/07/2023 NEGATIVE  NEGATIVE mg/dL Final   Hgb urine dipstick 01/07/2023 NEGATIVE  NEGATIVE Final   Bilirubin Urine 01/07/2023 NEGATIVE  NEGATIVE Final   Ketones, ur 01/07/2023 5 (A)  NEGATIVE mg/dL Final   Protein, ur 19/14/7829 NEGATIVE  NEGATIVE mg/dL Final   Nitrite 56/21/3086 NEGATIVE  NEGATIVE Final   Leukocytes,Ua 01/07/2023 NEGATIVE  NEGATIVE Final   Performed at [facility], [address], [place], [place] [address]   HIV Screen 4th Generation wRfx 01/07/2023 Non Reactive  Non Reactive Final   Performed at [facility], [address], [place], [place] [address]   SARSCOV2ONAVIRUS 2 AG 01/07/2023 NEGATIVE  NEGATIVE Final   Comment: (NOTE) SARS-CoV-2 antigen NOT DETECTED.   Negative results are presumptive.  Negative results do not preclude SARS-CoV-2 infection and should not be used as the sole basis for treatment or other patient management decisions, including infection  control decisions, particularly in the presence of clinical signs and  symptoms consistent with COVID-19, or in those who have been in contact with the virus.  Negative results must be combined with clinical observations, patient history, and epidemiological information. The expected result is Negative.  Fact Sheet for Patients: https://www.jennings-kim.com/  Fact Sheet for Healthcare Providers: https://alexander-rogers.biz/  This test is not yet approved or cleared by the [place] FDA and  has been authorized for detection and/or diagnosis of SARS-CoV-2 by FDA under an Emergency Use Authorization (EUA).  This EUA will remain in effect (meaning this test can be used) for the duration of  the COV                          ID-19 declaration under Section 564(b)(1) of the Act, 21 U.S.C.  section 360bbb-3(b)(1), unless the authorization is terminated or revoked sooner.     SARS Coronavirus 2 by RT PCR 01/07/2023 NEGATIVE  NEGATIVE Final   Performed at [facility], [address] [place], [place] [address]  Admission on 11/14/2022, Discharged on 11/16/2022  Component Date Value Ref Range Status  Sodium 11/14/2022 134 (L)  135 - 145 mmol/L Final   Potassium 11/14/2022 3.5  3.5 - 5.1 mmol/L Final   Chloride 11/14/2022 95 (L)  98 - 111 mmol/L Final   CO2 11/14/2022 27  22 - 32 mmol/L Final   Glucose, Bld 11/14/2022 104 (H)  70 - 99 mg/dL Final   Glucose reference range applies only to samples taken after fasting for at least 8 hours.   BUN 11/14/2022 15  6 - 20 mg/dL Final   Creatinine, Ser 11/14/2022 1.19  0.61 - 1.24 mg/dL Final   Calcium 16/07/9603 9.0  8.9 - 10.3 mg/dL Final   GFR, Estimated 11/14/2022 >60  >60 mL/min Final   Comment: (NOTE) Calculated using the CKD-EPI Creatinine Equation (2021)    Anion gap 11/14/2022 12  5 - 15 Final   Performed at [facility], [address], [place], [place] [address]   WBC 11/14/2022 25.4 (H)  4.0 - 10.5 K/uL Final   RBC 11/14/2022 4.30  4.22 - 5.81 MIL/uL Final   Hemoglobin 11/14/2022 13.4  13.0 - 17.0 g/dL Final   HCT 11/91/4782 41.8  39.0 - 52.0 % Final   MCV 11/14/2022 97.2  80.0 - 100.0 fL Final   MCH 11/14/2022 31.2  26.0 - 34.0 pg Final   MCHC 11/14/2022 32.1  30.0 - 36.0 g/dL Final   RDW 95/62/1308 13.3  11.5 - 15.5 % Final   Platelets 11/14/2022 325  150 - 400 K/uL Final   nRBC 11/14/2022 0.0  0.0 - 0.2 % Final   Performed at [facility], [address], [place], [place] [address]   Troponin I (High Sensitivity) 11/14/2022 2  <18 ng/L Final   Comment: (NOTE) Elevated high sensitivity troponin I (hsTnI) values and significant  changes across serial measurements may suggest ACS but many other  chronic and acute conditions are known to elevate hsTnI results.  Refer to  the "Links" section for chest pain algorithms and additional  guidance. Performed at [facility], [address] [place], [place] [address]    SARS Coronavirus 2 by RT PCR 11/14/2022 NEGATIVE  NEGATIVE Final   Comment: (NOTE) SARS-CoV-2 target nucleic acids are NOT DETECTED.  The SARS-CoV-2 RNA is generally detectable in upper respiratory specimens during the acute phase of infection. The lowest concentration of SARS-CoV-2 viral copies this assay can detect is 138 copies/mL. A negative result does not preclude SARS-Cov-2 infection and should not be used as the sole basis for treatment or other patient management decisions. A negative result may occur with  improper specimen collection/handling, submission of specimen other than nasopharyngeal swab, presence of viral mutation(s) within the areas targeted by this assay, and inadequate number of viral copies(<138 copies/mL). A negative result must be combined with clinical observations, patient history, and epidemiological information. The expected result is Negative.  Fact Sheet for Patients:  BloggerCourse.com  Fact Sheet for Healthcare Providers:  SeriousBroker.it  This test is no                          t yet approved or cleared by the [place] FDA and  has been authorized for detection and/or diagnosis of SARS-CoV-2 by FDA under an Emergency Use Authorization (EUA). This EUA will remain  in effect (meaning this test can be used) for the duration of the COVID-19 declaration under Section 564(b)(1) of the Act, 21 U.S.C.section 360bbb-3(b)(1), unless the authorization is terminated  or revoked sooner.       Influenza A by PCR 11/14/2022 NEGATIVE  NEGATIVE Final   Influenza B by PCR 11/14/2022 NEGATIVE  NEGATIVE Final   Comment: (NOTE) The Xpert Xpress SARS-CoV-2/FLU/RSV plus assay is intended as an aid in the diagnosis of influenza from  Nasopharyngeal swab specimens and should not be used as a sole basis for treatment. Nasal washings and aspirates are unacceptable for Xpert Xpress SARS-CoV-2/FLU/RSV testing.  Fact Sheet for Patients: BloggerCourse.com  Fact Sheet for Healthcare Providers: SeriousBroker.it  This test is not yet approved or cleared by the [place] FDA and has been authorized for detection and/or diagnosis of SARS-CoV-2 by FDA under an Emergency Use Authorization (EUA). This EUA will remain in effect (meaning this test can be used) for the duration of the COVID-19 declaration under Section 564(b)(1) of the Act, 21 U.S.C. section 360bbb-3(b)(1), unless the authorization is terminated or revoked.     Resp Syncytial Virus by PCR 11/14/2022 NEGATIVE  NEGATIVE Final   Comment: (NOTE) Fact Sheet for Patients: BloggerCourse.com  Fact Sheet for Healthcare Providers: SeriousBroker.it  This test is not yet approved or cleared by the [place] FDA and has been authorized for detection and/or diagnosis of SARS-CoV-2 by FDA under an Emergency Use Authorization (EUA). This EUA will remain in effect (meaning this test can be used) for the duration of the COVID-19 declaration under Section 564(b)(1) of the Act, 21 U.S.C. section 360bbb-3(b)(1), unless the authorization is terminated or revoked.  Performed at [facility], [address], [place], [place] [address]    D-Dimer, Quant 11/14/2022 1.78 (H)  0.00 - 0.50 ug/mL-FEU Final   Comment: (NOTE) At the manufacturer cut-off value of 0.5 g/mL FEU, this assay has a negative predictive value of 95-100%.This assay is intended for use in conjunction with a clinical pretest probability (PTP) assessment model to exclude pulmonary embolism (PE) and deep venous thrombosis (DVT) in outpatients suspected of PE or DVT. Results should be  correlated with clinical presentation. Performed at [facility], [address], [place], [place] [address]    Opiates 11/14/2022 NONE DETECTED  NONE DETECTED Final   Cocaine 11/14/2022 NONE DETECTED  NONE DETECTED Final   Benzodiazepines 11/14/2022 NONE DETECTED  NONE DETECTED Final   Amphetamines 11/14/2022 POSITIVE (A)  NONE DETECTED Final   Tetrahydrocannabinol 11/14/2022 POSITIVE (A)  NONE DETECTED Final   Barbiturates 11/14/2022 NONE DETECTED  NONE DETECTED Final   Comment: (NOTE) DRUG SCREEN FOR MEDICAL PURPOSES ONLY.  IF CONFIRMATION IS NEEDED FOR ANY PURPOSE, NOTIFY LAB WITHIN 5 DAYS.  LOWEST DETECTABLE LIMITS FOR URINE DRUG SCREEN Drug Class                     Cutoff (ng/mL) Amphetamine and metabolites    1000 Barbiturate and metabolites    200 Benzodiazepine                 200 Opiates and metabolites        300 Cocaine and metabolites        300 THC                            50 Performed at [facility], [address] [place], [place] [address]    Troponin I (High Sensitivity) 11/14/2022 3  <18 ng/L Final   Comment: (NOTE) Elevated high sensitivity troponin I (hsTnI) values and significant  changes across serial  measurements may suggest ACS but many other  chronic and acute conditions are known to elevate hsTnI results.  Refer to the "Links" section for chest pain algorithms and additional  guidance. Performed at [facility], [address], [place], [place] [address]    HIV Screen 4th Generation wRfx 11/14/2022 Non Reactive  Non Reactive Final   Performed at [facility], [address] [place], [place] [address]   Strep Pneumo Urinary Antigen 11/14/2022 NEGATIVE  NEGATIVE Final   Comment:        Infection due to S. pneumoniae cannot be absolutely ruled out since the antigen present may be below the detection limit of the test. Performed at [facility], [address],  [place], [place] [address]    L. pneumophila Serogp 1 Ur Ag 11/14/2022 Negative  Negative Final   Comment: (NOTE) Presumptive negative for L. pneumophila serogroup 1 antigen in urine, suggesting no recent or current infection. Legionnaires' disease cannot be ruled out since other serogroups and species may also cause disease. Performed At: [facility] [address] [place], [place] [id number] Jolene Schimke MD OZ:[id number]    Source of Sample 11/14/2022 URINE, CLEAN CATCH   Final   Performed at [facility], [address], [place], [place] [address]   Sodium 11/15/2022 133 (L)  135 - 145 mmol/L Final   Potassium 11/15/2022 3.3 (L)  3.5 - 5.1 mmol/L Final   Chloride 11/15/2022 99  98 - 111 mmol/L Final   CO2 11/15/2022 22  22 - 32 mmol/L Final   Glucose, Bld 11/15/2022 116 (H)  70 - 99 mg/dL Final   Glucose reference range applies only to samples taken after fasting for at least 8 hours.   BUN 11/15/2022 10  6 - 20 mg/dL Final   Creatinine, Ser 11/15/2022 0.73  0.61 - 1.24 mg/dL Final   Calcium 84/13/2440 7.9 (L)  8.9 - 10.3 mg/dL Final   Total Protein 08/04/2535 5.9 (L)  6.5 - 8.1 g/dL Final   Albumin 64/40/3474 2.9 (L)  3.5 - 5.0 g/dL Final   AST 25/95/6387 22  15 - 41 U/L Final   ALT 11/15/2022 22  0 - 44 U/L Final   Alkaline Phosphatase 11/15/2022 68  38 - 126 U/L Final   Total Bilirubin 11/15/2022 1.7 (H)  0.3 - 1.2 mg/dL Final   GFR, Estimated 11/15/2022 >60  >60 mL/min Final   Comment: (NOTE) Calculated using the CKD-EPI Creatinine Equation (2021)    Anion gap 11/15/2022 12  5 - 15 Final   Performed at [facility], [address] [place], [place] [address]   WBC 11/15/2022 25.4 (H)  4.0 - 10.5 K/uL Final   RBC 11/15/2022 3.27 (L)  4.22 - 5.81 MIL/uL Final   Hemoglobin 11/15/2022 10.2 (L)  13.0 - 17.0 g/dL Final   HCT 29/51/8841 32.0 (L)  39.0 - 52.0 % Final   MCV 11/15/2022 97.9  80.0 - 100.0 fL Final   MCH 11/15/2022 31.2   26.0 - 34.0 pg Final   MCHC 11/15/2022 31.9  30.0 - 36.0 g/dL Final   RDW 66/03/3015 13.5  11.5 - 15.5 % Final   Platelets 11/15/2022 245  150 - 400 K/uL Final   nRBC 11/15/2022 0.0  0.0 - 0.2 % Final   Neutrophils Relative % 11/15/2022 87  % Final   Neutro Abs 11/15/2022 21.9 (H)  1.7 - 7.7 K/uL Final   Lymphocytes Relative 11/15/2022 4  %  Final   Lymphs Abs 11/15/2022 1.0  0.7 - 4.0 K/uL Final   Monocytes Relative 11/15/2022 6  % Final   Monocytes Absolute 11/15/2022 1.6 (H)  0.1 - 1.0 K/uL Final   Eosinophils Relative 11/15/2022 0  % Final   Eosinophils Absolute 11/15/2022 0.0  0.0 - 0.5 K/uL Final   Basophils Relative 11/15/2022 0  % Final   Basophils Absolute 11/15/2022 0.1  0.0 - 0.1 K/uL Final   Immature Granulocytes 11/15/2022 3  % Final   Abs Immature Granulocytes 11/15/2022 0.75 (H)  0.00 - 0.07 K/uL Final   Performed at [facility], [address], [place], [place] [address]   Phosphorus 11/15/2022 3.3  2.5 - 4.6 mg/dL Final   Performed at [facility], [address] [place], [place] [address]   Magnesium 11/15/2022 1.8  1.7 - 2.4 mg/dL Final   Performed at [facility], [address], [place], [place] [address]   Specimen Description 11/14/2022    Final                   Value:BLOOD RIGHT ANTECUBITAL Performed at [facility], [address], [place], [place] [address]    Special Requests 11/14/2022    Final                   Value:BOTTLES DRAWN AEROBIC AND ANAEROBIC Blood Culture adequate volume Performed at [facility], [address] [place], [place] [address]    Culture 11/14/2022    Final                   Value:NO GROWTH 5 DAYS Performed at [facility], [address] [place], [place] [address]    Report Status 11/14/2022 11/19/2022 FINAL   Final   Specimen Description 11/14/2022    Final                   Value:BLOOD BLOOD RIGHT HAND Performed at [facility], [address] [place], [place] [address]    Special Requests 11/14/2022    Final                   Value:BOTTLES DRAWN AEROBIC AND ANAEROBIC Blood Culture adequate volume Performed at [facility] [facility], [address] [place], [place] [address]    Culture 11/14/2022    Final                   Value:NO GROWTH 5 DAYS Performed at [facility], [address], [place], [place] [address]    Report Status 11/14/2022 11/20/2022 FINAL   Final   Lactic Acid, Venous 11/15/2022 1.1  0.5 - 1.9 mmol/L Final   Performed at [facility], [address], [place], [place] [address]   Lactic Acid, Venous 11/15/2022 3.0 (HH)  0.5 - 1.9 mmol/L Final   Comment: CRITICAL RESULT CALLED TO, READ BACK BY AND VERIFIED WITH DARK,A. RN AT 1132 11/15/22 MULLINS,T Performed at [facility], [address], [place], [place] [address]    MRSA by PCR Next Gen 11/15/2022 NOT DETECTED  NOT DETECTED Final   Comment: (NOTE) The GeneXpert MRSA Assay (FDA approved for NASAL specimens only), is one component of a comprehensive MRSA colonization surveillance program. It is not intended to diagnose MRSA infection nor to guide or monitor treatment for MRSA infections. Test performance is not FDA  approved in patients less than 25 years old. Performed at [facility], [address] [place], [place] [address]    Lactic Acid, Venous 11/15/2022 1.3  0.5 - 1.9 mmol/L Final   Performed at [facility], [address], [place], [place] [address]   WBC 11/16/2022 13.9 (H)  4.0 - 10.5 K/uL Final   RBC 11/16/2022 3.12 (L)  4.22 - 5.81 MIL/uL Final   Hemoglobin 11/16/2022 9.7 (L)  13.0 - 17.0 g/dL Final   HCT 53/64/6803 30.5 (L)  39.0 - 52.0 % Final   MCV 11/16/2022 97.8  80.0 - 100.0 fL Final   MCH 11/16/2022 31.1  26.0 - 34.0 pg Final   MCHC 11/16/2022 31.8  30.0 - 36.0 g/dL Final   RDW 21/22/4825 13.5   11.5 - 15.5 % Final   Platelets 11/16/2022 243  150 - 400 K/uL Final   nRBC 11/16/2022 0.0  0.0 - 0.2 % Final   Neutrophils Relative % 11/16/2022 76  % Final   Neutro Abs 11/16/2022 10.6 (H)  1.7 - 7.7 K/uL Final   Lymphocytes Relative 11/16/2022 12  % Final   Lymphs Abs 11/16/2022 1.6  0.7 - 4.0 K/uL Final   Monocytes Relative 11/16/2022 10  % Final   Monocytes Absolute 11/16/2022 1.4 (H)  0.1 - 1.0 K/uL Final   Eosinophils Relative 11/16/2022 1  % Final   Eosinophils Absolute 11/16/2022 0.1  0.0 - 0.5 K/uL Final   Basophils Relative 11/16/2022 0  % Final   Basophils Absolute 11/16/2022 0.0  0.0 - 0.1 K/uL Final   Immature Granulocytes 11/16/2022 1  % Final   Abs Immature Granulocytes 11/16/2022 0.11 (H)  0.00 - 0.07 K/uL Final   Performed at [facility], [address], [place], [place] [address]   Sodium 11/16/2022 135  135 - 145 mmol/L Final   Potassium 11/16/2022 3.8  3.5 - 5.1 mmol/L Final   Chloride 11/16/2022 103  98 - 111 mmol/L Final   CO2 11/16/2022 23  22 - 32 mmol/L Final   Glucose, Bld 11/16/2022 88  70 - 99 mg/dL Final   Glucose reference range applies only to samples taken after fasting for at least 8 hours.   BUN 11/16/2022 8  6 - 20 mg/dL Final   Creatinine, Ser 11/16/2022 0.75  0.61 - 1.24 mg/dL Final   Calcium 48/88/9169 7.8 (L)  8.9 - 10.3 mg/dL Final   Total Protein 45/12/8880 6.0 (L)  6.5 - 8.1 g/dL Final   Albumin 80/12/4915 2.5 (L)  3.5 - 5.0 g/dL Final   AST 91/50/5697 13 (L)  15 - 41 U/L Final   ALT 11/16/2022 15  0 - 44 U/L Final   Alkaline Phosphatase 11/16/2022 57  38 - 126 U/L Final   Total Bilirubin 11/16/2022 0.7  0.3 - 1.2 mg/dL Final   GFR, Estimated 11/16/2022 >60  >60 mL/min Final   Comment: (NOTE) Calculated using the CKD-EPI Creatinine Equation (2021)    Anion gap 11/16/2022 9  5 - 15 Final   Performed at [facility], [address] [place], [place] [address]   Magnesium 11/16/2022 2.0  1.7 - 2.4  mg/dL Final   Performed at [facility], [address], [place], [place] [address]   Procalcitonin 11/16/2022 4.93  ng/mL Final   Comment:        Interpretation: PCT > 2 ng/mL: Systemic infection (sepsis) is likely, unless other causes are known. (NOTE)  Sepsis PCT Algorithm           Lower Respiratory Tract                                      Infection PCT Algorithm    ----------------------------     ----------------------------         PCT < 0.25 ng/mL                PCT < 0.10 ng/mL          Strongly encourage             Strongly discourage   discontinuation of antibiotics    initiation of antibiotics    ----------------------------     -----------------------------       PCT 0.25 - 0.50 ng/mL            PCT 0.10 - 0.25 ng/mL               OR       >80% decrease in PCT            Discourage initiation of                                            antibiotics      Encourage discontinuation           of antibiotics    ----------------------------     -----------------------------         PCT >= 0.50 ng/mL              PCT 0.26 - 0.50 ng/mL               AND       <80% decrease in PCT                                       Encourage initiation of                                             antibiotics       Encourage continuation           of antibiotics    ----------------------------     -----------------------------        PCT >= 0.50 ng/mL                  PCT > 0.50 ng/mL               AND         increase in PCT                  Strongly encourage                                      initiation of antibiotics    Strongly encourage escalation           of antibiotics                                     -----------------------------  PCT <= 0.25 ng/mL                                                 OR                                        > 80% decrease in PCT                                       Discontinue / Do not initiate                                             antibiotics  Performed at [facility], [address] [place], [place] [address]   Admission on 10/29/2022, Discharged on 10/30/2022  Component Date Value Ref Range Status   SARS Coronavirus 2 by RT PCR 10/29/2022 NEGATIVE  NEGATIVE Final   Comment: (NOTE) SARS-CoV-2 target nucleic acids are NOT DETECTED.  The SARS-CoV-2 RNA is generally detectable in upper respiratory specimens during the acute phase of infection. The lowest concentration of SARS-CoV-2 viral copies this assay can detect is 138 copies/mL. A negative result does not preclude SARS-Cov-2 infection and should not be used as the sole basis for treatment or other patient management decisions. A negative result may occur with  improper specimen collection/handling, submission of specimen other than nasopharyngeal swab, presence of viral mutation(s) within the areas targeted by this assay, and inadequate number of viral copies(<138 copies/mL). A negative result must be combined with clinical observations, patient history, and epidemiological information. The expected result is Negative.  Fact Sheet for Patients:  BloggerCourse.com  Fact Sheet for Healthcare Providers:  SeriousBroker.it  This test is no                          t yet approved or cleared by the [place] FDA and  has been authorized for detection and/or diagnosis of SARS-CoV-2 by FDA under an Emergency Use Authorization (EUA). This EUA will remain  in effect (meaning this test can be used) for the duration of the COVID-19 declaration under Section 564(b)(1) of the Act, 21 U.S.C.section 360bbb-3(b)(1), unless the authorization is terminated  or revoked sooner.       Influenza A by PCR 10/29/2022 NEGATIVE  NEGATIVE Final   Influenza B by PCR 10/29/2022 NEGATIVE  NEGATIVE Final   Comment: (NOTE) The Xpert  Xpress SARS-CoV-2/FLU/RSV plus assay is intended as an aid in the diagnosis of influenza from Nasopharyngeal swab specimens and should not be used as a sole basis for treatment. Nasal washings and aspirates are unacceptable for Xpert Xpress SARS-CoV-2/FLU/RSV testing.  Fact Sheet for Patients: BloggerCourse.com  Fact Sheet for Healthcare Providers: SeriousBroker.it  This test is not yet approved or cleared by the [place] FDA and has been authorized for detection and/or diagnosis of SARS-CoV-2 by FDA under an Emergency Use Authorization (EUA). This EUA will remain in effect (meaning this test can be used) for the duration of the COVID-19 declaration under Section 564(b)(1) of  the Act, 21 U.S.C. section 360bbb-3(b)(1), unless the authorization is terminated or revoked.     Resp Syncytial Virus by PCR 10/29/2022 NEGATIVE  NEGATIVE Final   Comment: (NOTE) Fact Sheet for Patients: BloggerCourse.com  Fact Sheet for Healthcare Providers: SeriousBroker.it  This test is not yet approved or cleared by the [place] FDA and has been authorized for detection and/or diagnosis of SARS-CoV-2 by FDA under an Emergency Use Authorization (EUA). This EUA will remain in effect (meaning this test can be used) for the duration of the COVID-19 declaration under Section 564(b)(1) of the Act, 21 U.S.C. section 360bbb-3(b)(1), unless the authorization is terminated or revoked.  Performed at [facility], [address] [place], [place] [address]    Group A Strep by PCR 10/29/2022 NOT DETECTED  NOT DETECTED Final   Performed at [facility], [address] [place], [place] [address]    Blood Alcohol level:  Lab Results  Component Value Date   ETH <10 01/07/2023    Metabolic Disorder Labs: Lab Results  Component Value Date   HGBA1C 4.9 01/07/2023    MPG 94 01/07/2023   No results found for: "PROLACTIN" Lab Results  Component Value Date   CHOL 184 01/07/2023   TRIG 151 (H) 01/07/2023   HDL 56 01/07/2023   CHOLHDL 3.3 01/07/2023   VLDL 30 01/07/2023   LDLCALC 98 01/07/2023    Therapeutic Lab Levels: No results found for: "LITHIUM" No results found for: "VALPROATE" No results found for: "CBMZ"  Physical Findings   PHQ2-9    Flowsheet Row ED from 01/07/2023 in [facility]  PHQ-2 Total Score 2  PHQ-9 Total Score 11      Flowsheet Row ED from 01/07/2023 in [facility] ED to Hosp-Admission (Discharged) from 11/14/2022 in [facility] ED from 10/29/2022 in [facility] Emergency Department at [facility]  C-SSRS RISK CATEGORY Low Risk No Risk No Risk        Musculoskeletal  Strength & Muscle Tone: within normal limits Gait & Station: normal Patient leans: N/A  Psychiatric Specialty Exam  Presentation  General Appearance:  Appropriate for Environment; Casual  Eye Contact: Good  Speech: Clear and Coherent; Normal Rate  Speech Volume: Normal  Handedness: Right   Mood and Affect  Mood: Dysphoric Affect: Congruent; Full Range   Thought Process  Thought Processes: Coherent; Goal Directed; Linear  Descriptions of Associations:Intact  Orientation:Full (Time, Place and Person)  Thought Content:Logical  Diagnosis of Schizophrenia or Schizoaffective disorder in past: No    Hallucinations:Hallucinations: None    Ideas of Reference:None  Suicidal Thoughts:Suicidal Thoughts: No    Homicidal Thoughts:Homicidal Thoughts: No     Sensorium  Memory: Immediate Good; Recent Good  Judgment: Fair  Insight: Fair   Art therapist  Concentration: Good  Attention Span: Good  Recall: Good  Fund of Knowledge: Good  Language: Good   Psychomotor Activity  Psychomotor  Activity: Psychomotor Activity: Normal     Assets  Assets: Communication Skills; Desire for Improvement; Physical Health; Social Support   Sleep  Sleep: Good    Physical Exam  Physical Exam Vitals reviewed.  Constitutional:      General: He is not in acute distress. HENT:     Head: Normocephalic and atraumatic.  Pulmonary:     Effort: Pulmonary effort is normal.  Neurological:     Mental Status: He is alert.    Review of  Systems  Constitutional:  Negative for diaphoresis and weight loss.  Gastrointestinal:  Negative for abdominal pain, diarrhea, nausea and vomiting.   Blood pressure 115/80, pulse 82, temperature 97.7 F (36.5 C), temperature source Tympanic, resp. rate 18, weight 185 lb (83.9 kg), SpO2 100 %. Body mass index is 25.09 kg/m.  Treatment Plan Summary: Daily contact with patient to assess and evaluate symptoms and progress in treatment and Medication management  Stimulant use disorder Opioid use disorder -COWS monitoring was discontinued yesterday as patient was asymptomatic for greater than 24 hours. - Other PRNs -Maalox 30 ml p.o. every 4 hours as needed indigestion -MOM 30 ml p.o. daily as needed constipation -Melatonin 5 mg p.o. nightly as needed sleep -Bentyl 20 mg p.o. every 6 hours as needed spasms, abdominal cramps -Atarax 25 mg p.o. 3 times daily as needed anxiety -Imodium 2- 4 mg p.o. as needed diarrhea or loose stools -Robaxin 500 mg p.o. every 8 hours as needed muscle spasms -Naproxen 500 mg p.o. twice daily as needed aches and pains -Zofran ODT 4 mg p.o. every 6 hours as needed nausea, vomiting   Constipation -Continue MOM -Start Colace 100 mg daily.  History of bipolar disorder -Continue Seroquel 50 mg qHS for mood instability.  Dispo: Patient has been accepted for [facility] for Monday.  Karsten Ro, MD 01/11/2023 2:34 PM

## 2023-01-12 NOTE — ED Notes (Signed)
Patient observed/assessed at bedside. Patient alert and oriented x 4. Affect is flat. Patient denies pain and anxiety. He denies A/V/H. He denies having any thoughts/plan of self harm and harm towards others. Fluid and snack offered. Patient states that appetite has been good throughout the day. Verbalizes no further complaints at this time. Will continue to monitor and support.  

## 2023-01-13 DIAGNOSIS — F319 Bipolar disorder, unspecified: Secondary | ICD-10-CM | POA: Diagnosis not present

## 2023-01-13 DIAGNOSIS — R45851 Suicidal ideations: Secondary | ICD-10-CM | POA: Diagnosis not present

## 2023-01-13 DIAGNOSIS — F1910 Other psychoactive substance abuse, uncomplicated: Secondary | ICD-10-CM | POA: Diagnosis not present

## 2023-01-13 DIAGNOSIS — Z1152 Encounter for screening for COVID-19: Secondary | ICD-10-CM | POA: Diagnosis not present

## 2023-01-13 MED ORDER — QUETIAPINE FUMARATE 50 MG PO TABS: 50.0000 mg | ORAL_TABLET | Freq: Every day | ORAL | 0 refills | Status: AC

## 2023-01-13 MED ORDER — HYDROXYZINE HCL 25 MG PO TABS: 25.0000 mg | ORAL_TABLET | Freq: Three times a day (TID) | ORAL | 0 refills | Status: AC | PRN

## 2023-01-13 NOTE — ED Notes (Signed)
While administering medication, patient has complaint of trouble sleeping, as well as anxiety due to his pending d/c and admission to [facility].

## 2023-01-13 NOTE — ED Notes (Signed)
Pt in hallway looking at book in no acute distress. Denies concerns or needs at present. Denies SI/HI/AVH. Informed pt to notify staff with any needs. Environment secured. Will continue to monitor for safety.

## 2023-01-13 NOTE — ED Provider Notes (Signed)
FBC/OBS ASAP Discharge Summary  Date and Time: 01/13/2023 4:08 PM  Name: Ronnie Herman  MRN:  [id number]   Discharge Diagnoses:  Final diagnoses:  Polysubstance abuse    Subjective: Ronnie Herman is a 37 year old male with a psychiatric history of bipolar 1 disorder and polysubstance use (crack cocaine, meth, and marijuana) who was admitted to [facility] 4/1 for detox and assistance with substance use treatment.   Stay Summary: Pt was admitted to [facility] for detox and stabilization of his symptoms.  Patient was started on Ativan detox and as needed's medications for withdrawal symptoms.  He was started on COWS and clonidine withdrawal protocol.  Clonidine was discontinued after 1 day as patient did not have any withdrawal symptoms.  Patient was also started on Seroquel to help with mood and sleep.  He tolerated it well.  Pt was reevaluated this morning. Today, he reported stable mood and denied SI, HI and AVH.  He has been sleeping and eating well.  Denied any side effects from medications.  Patient has been accepted to [facility] for inpatient rehab 4/8.  Patient will be discharged on 01/14/2023.  He feels safe to go to [facility].  Patient given 30-day prescription with 28 days samples of Seroquel and hydroxyzine.   Total Time spent with patient: 20 minutes  Past Psychiatric History: Patient has a several forted psychiatric history of bipolar 1 disorder and polysubstance abuse (methamphetamines, cocaine, and marijuana).  Past Medical History:      Past Medical History:  Diagnosis Date   Bipolar 1 disorder (HCC)     Drug abuse (HCC)      Family History: None reported Social History: "using meth, cocaine, and marijuana daily for the past 4 years. He has participated in residential substance abuse treatment in the past roughly 3-4 years ago. He is unsure of the amount that he is using but he is snorting cocaine and smoking daily. He is injecting methamphetamines daily." Tobacco Cessation:  N/A, patient  does not currently use tobacco products  Current Medications:  Current Facility-Administered Medications  Medication Dose Route Frequency Provider Last Rate Last Admin   acetaminophen (TYLENOL) tablet 650 mg  650 mg Oral Q6H PRN Ardis Hughs, NP       alum & mag hydroxide-simeth (MAALOX/MYLANTA) 200-200-20 MG/5ML suspension 30 mL  30 mL Oral Q4H PRN Ardis Hughs, NP   30 mL at 01/11/23 1027   cloNIDine (CATAPRES) tablet 0.1 mg  0.1 mg Oral Q6H PRN Lamar Sprinkles, MD       dicyclomine (BENTYL) tablet 20 mg  20 mg Oral Q6H PRN Lamar Sprinkles, MD       docusate sodium (COLACE) capsule 100 mg  100 mg Oral Daily PRN Karsten Ro, MD       hydrOXYzine (ATARAX) tablet 25 mg  25 mg Oral Q6H PRN Lamar Sprinkles, MD   25 mg at 01/12/23 2124   hydrOXYzine (ATARAX) tablet 50 mg  50 mg Oral QHS PRN Lamar Sprinkles, MD   50 mg at 01/12/23 2124   loperamide (IMODIUM) capsule 2-4 mg  2-4 mg Oral PRN Lamar Sprinkles, MD       magnesium hydroxide (MILK OF MAGNESIA) suspension 30 mL  30 mL Oral Daily PRN Ardis Hughs, NP   30 mL at 01/12/23 1306   methocarbamol (ROBAXIN) tablet 500 mg  500 mg Oral Q8H PRN Lamar Sprinkles, MD       naproxen (NAPROSYN) tablet 500 mg  500 mg Oral BID PRN Cosby,  Toni Amend, MD       ondansetron (ZOFRAN-ODT) disintegrating tablet 4 mg  4 mg Oral Q6H PRN Lamar Sprinkles, MD       QUEtiapine (SEROQUEL) tablet 50 mg  50 mg Oral QHS Lamar Sprinkles, MD   50 mg at 01/12/23 2125   No current outpatient medications on file.    PTA Medications:  Facility Ordered Medications  Medication   acetaminophen (TYLENOL) tablet 650 mg   alum & mag hydroxide-simeth (MAALOX/MYLANTA) 200-200-20 MG/5ML suspension 30 mL   magnesium hydroxide (MILK OF MAGNESIA) suspension 30 mL   dicyclomine (BENTYL) tablet 20 mg   hydrOXYzine (ATARAX) tablet 25 mg   loperamide (IMODIUM) capsule 2-4 mg   methocarbamol (ROBAXIN) tablet 500 mg   naproxen (NAPROSYN) tablet 500 mg   ondansetron  (ZOFRAN-ODT) disintegrating tablet 4 mg   hydrOXYzine (ATARAX) tablet 50 mg   cloNIDine (CATAPRES) tablet 0.1 mg   QUEtiapine (SEROQUEL) tablet 50 mg   docusate sodium (COLACE) capsule 100 mg       01/10/2023    3:24 PM 01/07/2023    5:33 PM  Depression screen PHQ 2/9  Decreased Interest 1 2  Down, Depressed, Hopeless 1 3  PHQ - 2 Score 2 5  Altered sleeping 2 2  Tired, decreased energy 2 2  Change in appetite 2 3  Feeling bad or failure about yourself  2 3  Moving slowly or fidgety/restless 1 2  Suicidal thoughts 0 3  PHQ-9 Score 11 20  Difficult doing work/chores  Very difficult    Flowsheet Row ED from 01/07/2023 in [facility] ED to Hosp-Admission (Discharged) from 11/14/2022 in [facility] LONG 4TH FLOOR PROGRESSIVE CARE AND UROLOGY ED from 10/29/2022 in [facility] Emergency Department at [facility]  C-SSRS RISK CATEGORY Low Risk No Risk No Risk       Musculoskeletal  Strength & Muscle Tone: within normal limits Gait & Station: normal Patient leans: N/A  Psychiatric Specialty Exam  Presentation  General Appearance:  Appropriate for Environment; Casual  Eye Contact: Good  Speech: Clear and Coherent; Normal Rate  Speech Volume: Normal  Handedness: Right   Mood and Affect  Mood: Euthymic (Euthymic but questioing/uncertain)  Affect: Congruent; Full Range   Thought Process  Thought Processes: Coherent; Goal Directed; Linear  Descriptions of Associations:Intact  Orientation:Full (Time, Place and Person)  Thought Content:Logical  Diagnosis of Schizophrenia or Schizoaffective disorder in past: No    Hallucinations:No data recorded Ideas of Reference:None  Suicidal Thoughts:No data recorded Homicidal Thoughts:No data recorded  Sensorium  Memory: Immediate Good; Recent Good  Judgment: Fair  Insight: Fair; Shallow   Executive Functions  Concentration: Good  Attention Span: Good  Recall: Good  Fund  of Knowledge: Good  Language: Good   Psychomotor Activity  Psychomotor Activity:No data recorded  Assets  Assets: Communication Skills; Desire for Improvement; Physical Health; Social Support   Sleep  Sleep:No data recorded  No data recorded  Physical Exam  Physical Exam Review of Systems  Constitutional:  Negative for chills and fever.  Respiratory:  Negative for cough and shortness of breath.   Cardiovascular:  Negative for chest pain.  Gastrointestinal:  Negative for abdominal pain, constipation, diarrhea, nausea and vomiting.  Neurological:  Negative for dizziness and headaches.  Psychiatric/Behavioral:  Negative for hallucinations and suicidal ideas. The patient does not have insomnia.    Blood pressure 111/82, pulse 82, temperature 98 F (36.7 C), temperature source Oral, resp. rate 20, weight 185 lb (83.9 kg),  SpO2 100 %. Body mass index is 25.09 kg/m.  Demographic Factors:  Male, Living alone, and Unemployed  Loss Factors: Decrease in vocational status and Financial problems/change in socioeconomic status  Historical Factors: NA  Risk Reduction Factors:   Positive coping skills or problem solving skills  Continued Clinical Symptoms:  Bipolar Disorder:   Depressive phase Alcohol/Substance Abuse/Dependencies More than one psychiatric diagnosis Previous Psychiatric Diagnoses and Treatments  Cognitive Features That Contribute To Risk:  Closed-mindedness and Thought constriction (tunnel vision)    Suicide Risk:  Minimal: No identifiable suicidal ideation.  Patients presenting with no risk factors but with morbid ruminations; may be classified as minimal risk based on the severity of the depressive symptoms  Plan Of Care/Follow-up recommendations:  Activity:  As tolerated Diet:  Regular  Disposition: [facility]14/[facility].  Patient given 28 days samples for medications.  Patient given 30-day medications prescriptions.  [facility] [address] [place], [place], [address] [phone] phone   New Patient Assessment/Therapy Walk-Ins:   Monday and Wednesday: 8 am until slots are full. Every 1st and 2nd Fridays of the month: 1 pm - 5 pm.   NO ASSESSMENT/THERAPY WALK-INS ON TUESDAYS OR THURSDAYS   New Patient Assessment/Medication Management Walk-Ins:   Monday - Friday:  8 am - 11 am.   For all walk-ins, we ask that you arrive by 7:30 am because patients will be seen in the order of arrival.  Availability is limited; therefore, you may not be seen on the same day that you walk-in.  Our goal is to serve and meet the needs of our community to the best of our ability.   SUBSTANCE USE TREATMENT for Medicaid and State Funded/IPRS   [facility] [address] [place], [place], [address] [phone] phone NOTE: [facility] is no longer offering IOP services.  Serves those who are low-income or have no insurance.   [facility] [address], [place], [place], [address] [phone] phone [phone] fax NOTE: Does have Substance Abuse-Intensive Outpatient Program [facility](SAIOP) as well as transitional housing if eligible.   [facility] [address] [place], [place], [address] [phone] phone [phone] fax   [facility] [phone] [place], [place], [address] [phone] phone [phone] fax   HALFWAY HOUSES:   [facility] [phone]   [facility] www.oxfordvacancies.com   12 STEP PROGRAMS:   Alcoholics Anonymous of [employer]behttps://aagreensboronc.com/meeting   Narcotics Anonymous of [place] [employer]dkhttps://greensborona.org/meetings/   Al-Anon of [place], [place] www.greens[place]alanon.org/find-meetings.html   Nar-Anon https://nar-anon.org/find-a-meetin   List of Residential placements:  [facility] in [place]: [phone]   [facility]: [phone]   [facility]:  [place], [place] [phone]: Male and male facility; 30-day program: (uninsured and Medicaid such as [facility], [facility], [facility])   [facility]: [phone]; men and women's facility; 28 days; Can have Medicaid tailored plan Tour manager([facility]iance or Partners)   [facility]: [phone] Karoline Caldwellngie or Larita FifeLynn; 28 day program; must be fully detox; tailored Medicaid or no insurance   [facility] [facility] [place], [place]; [phone]; 28 day all males program; no insurance accepted   [facility] Ref[facility] in [place]: Gabriel RungJoe [phone] (no insurance or Medicaid only); 90 days; outpatient services but provide housing in apartments [place]   [facility] Admission: [phone]: Patient must complete phone screening for placement: [place]; 6 month program; uninsured, Medicaid, and [employer] insurance.    [facility]: no insurance required; [phone]  [facility]: [phone]; Intake: Molly Maduro; Must fill out application online; Alecia Lemming Delay [phone] x [address] [facility] in [place], [place]: [phone]; Admissions Coordinators Mr. Maurine Minister or Barron Alvine; 90 day program.   [facility]: [place], [place] [phone]; Co-Ed 9 month to a year program; Online application; Men entry fee is $500 (6-33months);   [employer]: [address] [place], [place] [address]; no fee or insurance required; minimum of 2 years; Highly structured; work based; Intake Coordinator is Thayer Ohm [phone]   [facility] in [place], [place]: [phone]; Fax number is [phone]; website: www.[facility].org; Requires 3-6 page autobiography; 2 year program (18 months and then 73month transitional housing); Admission fee is $300; no insurance needed; work Secondary school teacher in [place], [place]: [employer] Desk Staff: Danise Edge [phone]: They have a Men's Regenerations Program 6-27months. Free program; There is an initial $300 fee however,  they are willing to work with patients regarding that. Application is online.   [facility]: Admissions [phone] Doran Heater ext [phone]; Any 7-90 day program is out of pocket; 12 month program is free of charge; there is a $275 entry fee; Patient is responsible for own transportation   Karsten Ro, MD 01/13/2023, 4:08 PM

## 2023-01-13 NOTE — ED Notes (Signed)
Snacks given 

## 2023-01-13 NOTE — Group Note (Signed)
Group Topic: Understanding Self  Group Date: 01/13/2023 Start Time: 1630 End Time: 1704 Facilitators: Vonzell Schlatter B  Department: [facility]  Number of Participants: 8  Group Focus: self-awareness Treatment Modality:  Psychoeducation Interventions utilized were problem solving Purpose: express feelings  Name: Ronnie Herman Date of Birth: [birth year]  MR: [id number]    Level of Participation: active Quality of Participation: attentive and cooperative Interactions with others: gave feedback Mood/Affect: positive Triggers (if applicable): na Cognition: coherent/clear Progress: Moderate Response: na Plan: follow-up needed  Patients Problems:  Patient Active Problem List   Diagnosis Date Noted   Sepsis due to pneumonia 11/15/2022   Hypokalemia 11/15/2022   Dental infection 11/15/2022   Lactic acidosis 11/15/2022   Pneumonia of both lower lobes due to infectious organism 11/14/2022   Polysubstance abuse 11/14/2022   Bipolar 1 disorder 11/14/2022

## 2023-01-13 NOTE — ED Notes (Signed)
Patient observed/assessed in room in bed appearing in no immediate distress resting peacefully. Q15 minute checks continued by MHT and nursing staff. Will continue to monitor and support. 

## 2023-01-13 NOTE — ED Notes (Signed)
Patient A&Ox4. Denies intent to harm self/others when asked. Denies A/VH. Patient denies any physical complaints when asked. No acute distress noted. Pleasant and cooperative with staff. Denies withdrawal sx at present. Support and encouragement provided. Routine safety checks conducted according to facility protocol. Encouraged patient to notify staff if thoughts of harm toward self or others arise. Patient verbalize understanding and agreement. Will continue to monitor for safety.

## 2023-01-13 NOTE — ED Notes (Signed)
Patient is currently sitting in the dining room watching television, no distress noted, will continue to monitor patient for safety. 

## 2023-01-14 DIAGNOSIS — Z1152 Encounter for screening for COVID-19: Secondary | ICD-10-CM | POA: Diagnosis not present

## 2023-01-14 DIAGNOSIS — R45851 Suicidal ideations: Secondary | ICD-10-CM | POA: Diagnosis not present

## 2023-01-14 DIAGNOSIS — F1910 Other psychoactive substance abuse, uncomplicated: Secondary | ICD-10-CM | POA: Diagnosis not present

## 2023-01-14 DIAGNOSIS — F319 Bipolar disorder, unspecified: Secondary | ICD-10-CM | POA: Diagnosis not present

## 2023-01-14 NOTE — ED Notes (Signed)
Patient A&O x 4, ambulatory. Patient discharged in no acute distress. Patient denied SI/HI, A/VH upon discharge. Patient verbalized understanding of all discharge instructions explained by staff, to include follow up appointments, RX's and safety plan. Patient reported mood 10/10.  Pt belongings returned to patient from locker #  intact. Patient escorted to lobby via staff for transport to destination. Safety maintained.   

## 2023-01-14 NOTE — ED Notes (Signed)
Patient A&Ox4. Denies intent to harm self/others when asked. Denies A/VH. Patient denies any physical complaints when asked. No acute distress noted. Pt voiced enthusiasm about being transferred to [facility] to continue recovery this am. Support and encouragement provided. Routine safety checks conducted according to facility protocol. Encouraged patient to notify staff if thoughts of harm toward self or others arise. Patient verbalize understanding and agreement. Will continue to monitor for safety.

## 2023-01-14 NOTE — ED Notes (Signed)
Pt is currently sleeping, no distress noted, environmental check complete, will continue to monitor patient for safety.  

## 2023-02-02 ENCOUNTER — Other Ambulatory Visit (HOSPITAL_COMMUNITY): Payer: Self-pay

## 2023-02-05 ENCOUNTER — Encounter (HOSPITAL_BASED_OUTPATIENT_CLINIC_OR_DEPARTMENT_OTHER): Payer: Self-pay | Admitting: Emergency Medicine

## 2023-02-05 ENCOUNTER — Other Ambulatory Visit: Payer: Self-pay

## 2023-02-05 ENCOUNTER — Emergency Department (HOSPITAL_BASED_OUTPATIENT_CLINIC_OR_DEPARTMENT_OTHER)
Admission: EM | Admit: 2023-02-05 | Discharge: 2023-02-05 | Disposition: A | Discharge status: Home / Self Care | Payer: 59 | Attending: Emergency Medicine | Admitting: Emergency Medicine

## 2023-02-05 DIAGNOSIS — L02415 Cutaneous abscess of right lower limb: Secondary | ICD-10-CM | POA: Insufficient documentation

## 2023-02-05 HISTORY — DX: Methicillin resistant Staphylococcus aureus infection, unspecified site: A49.02

## 2023-02-05 MED ORDER — DOXYCYCLINE HYCLATE 100 MG PO CAPS: 100.0000 mg | ORAL_CAPSULE | Freq: Two times a day (BID) | ORAL | 0 refills | Status: AC

## 2023-02-05 MED ORDER — KETOROLAC TROMETHAMINE 30 MG/ML IJ SOLN
30.0000 mg | Freq: Once | INTRAMUSCULAR | Status: AC
  Administered 2023-02-05: 30 mg via INTRAMUSCULAR
  Filled 2023-02-05: qty 1

## 2023-02-05 MED ORDER — NAPROXEN 500 MG PO TABS: ORAL_TABLET | ORAL | 0 refills | Status: AC

## 2023-02-05 MED ORDER — DOXYCYCLINE HYCLATE 100 MG PO TABS
100.0000 mg | ORAL_TABLET | Freq: Once | ORAL | Status: AC
  Administered 2023-02-05: 100 mg via ORAL
  Filled 2023-02-05: qty 1

## 2023-02-05 NOTE — ED Triage Notes (Signed)
Per EMS pt called for "boil" on upper right thigh. Denies fever, possible spider bite. Pt is from rehab facility.

## 2023-02-05 NOTE — ED Provider Notes (Signed)
[facility]-EMERGENCY DEPT [facility] Provider Note: Lowella Dell, MD, FACEP  CSN: [id number] MRN: [id number] ARRIVAL: 02/05/23 at 0347 ROOM: MH02/MH02   CHIEF COMPLAINT  Abscess   HISTORY OF PRESENT ILLNESS  02/05/23 4:39 AM Ronnie Herman is a 37 y.o. male with what he believes to be a boil on his right upper outer thigh for the past 4 days.  He rates associated pain as a 10 out of 10.  He is currently in substance abuse rehab.   Past Medical History:  Diagnosis Date   Bipolar 1 disorder (HCC)    Drug abuse (HCC)    MRSA infection     History reviewed. No pertinent surgical history.  Family History  Problem Relation Age of Onset   Hypertension Mother    Diabetes Father     Social History   Tobacco Use   Smoking status: Never   Smokeless tobacco: Never  Substance Use Topics   Alcohol use: Not Currently   Drug use: Not Currently    Types: Amphetamines    Prior to Admission medications   Medication Sig Start Date End Date Taking? Authorizing Provider  doxycycline (VIBRAMYCIN) 100 MG capsule Take 1 capsule (100 mg total) by mouth 2 (two) times daily. One po bid x 7 days 02/05/23  Yes Mahum Betten, MD  naproxen (NAPROSYN) 500 MG tablet Take 1 tablet twice daily as needed for pain. 02/05/23  Yes Javeria Briski, MD  hydrOXYzine (ATARAX) 25 MG tablet Take 1 tablet (25 mg total) by mouth 3 (three) times daily as needed for anxiety. 01/13/23   Karsten Ro, MD  QUEtiapine (SEROQUEL) 50 MG tablet Take 1 tablet (50 mg total) by mouth at bedtime. 01/13/23   Karsten Ro, MD    Allergies Patient has no known allergies.   REVIEW OF SYSTEMS  Negative except as noted here or in the History of Present Illness.   PHYSICAL EXAMINATION  Initial Vital Signs Blood pressure (!) 123/96, pulse 96, temperature 98.1 F (36.7 C), temperature source Oral, resp. rate 20, height 6' (1.829 m), weight 86.2 kg, SpO2 99 %.  Examination General: Well-developed, well-nourished male in no acute  distress; appearance consistent with age of record HENT: normocephalic; atraumatic Eyes: Normal appearance Neck: supple Heart: regular rate and rhythm Lungs: clear to auscultation bilaterally Abdomen: soft; nondistended; nontender; bowel sounds present Extremities: No deformity; full range of motion Neurologic: Awake, alert and oriented; motor function intact in all extremities and symmetric; no facial droop Skin: Warm and dry; tender, fluctuant mass right lateral thigh consistent with abscess:    Psychiatric: Normal mood and affect   RESULTS  Summary of this visit's results, reviewed and interpreted by myself:   EKG Interpretation  Date/Time:    Ventricular Rate:    PR Interval:    QRS Duration:   QT Interval:    QTC Calculation:   R Axis:     Text Interpretation:         Laboratory Studies: No results found for this or any previous visit (from the past 24 hour(s)). Imaging Studies: No results found.  ED COURSE and MDM  Nursing notes, initial and subsequent vitals signs, including pulse oximetry, reviewed and interpreted by myself.  Vitals:   02/05/23 0349 02/05/23 0400 02/05/23 0401  BP:  (!) 123/96   Pulse:  96   Resp:  20   Temp:  98.1 F (36.7 C)   TempSrc:  Oral   SpO2: 96% 99%   Weight:   86.2 kg  Height:  6' (1.829 m)   Medications  doxycycline (VIBRA-TABS) tablet 100 mg (has no administration in time range)  ketorolac (TORADOL) 30 MG/ML injection 30 mg (has no administration in time range)   Patient refused I&D.  We will treat with doxycycline.  PROCEDURES  Procedures   ED DIAGNOSES     ICD-10-CM   1. Abscess of right thigh  L02.415          Jarrin Staley, Jonny Ruiz, MD 02/05/23 [phone]

## 2023-02-05 NOTE — ED Triage Notes (Signed)
Pt states boil on right upper outer thigh since Friday. Skin swollen and tight.

## 2023-09-17 ENCOUNTER — Other Ambulatory Visit: Payer: Self-pay
